# Patient Record
Sex: Male | Born: 1975 | Race: Black or African American | Hispanic: No | Smoking: Current every day smoker
Health system: Southern US, Community
[De-identification: ages and names within clinical notes are randomized; demographics above are authoritative.]

## PROBLEM LIST (undated history)

## (undated) HISTORY — PX: FRACTURE SURGERY: SHX138

---

## 2004-01-15 ENCOUNTER — Emergency Department (HOSPITAL_COMMUNITY): Admission: EM | Admit: 2004-01-15 | Discharge: 2004-01-15 | Payer: Self-pay | Admitting: Emergency Medicine

## 2018-12-24 ENCOUNTER — Emergency Department (HOSPITAL_COMMUNITY): Payer: Medicaid Other

## 2018-12-24 ENCOUNTER — Emergency Department (HOSPITAL_COMMUNITY): Payer: Medicaid Other | Admitting: Anesthesiology

## 2018-12-24 ENCOUNTER — Observation Stay (HOSPITAL_COMMUNITY)
Admission: EM | Admit: 2018-12-24 | Discharge: 2018-12-25 | Disposition: A | Discharge status: Home / Self Care | Payer: Medicaid Other | Attending: Orthopedic Surgery | Admitting: Orthopedic Surgery

## 2018-12-24 ENCOUNTER — Encounter (HOSPITAL_COMMUNITY)
Admission: EM | Disposition: A | Discharge status: Home / Self Care | Payer: Self-pay | Source: Home / Self Care | Attending: Emergency Medicine

## 2018-12-24 ENCOUNTER — Other Ambulatory Visit: Payer: Self-pay

## 2018-12-24 ENCOUNTER — Encounter (HOSPITAL_COMMUNITY): Payer: Self-pay | Admitting: Emergency Medicine

## 2018-12-24 DIAGNOSIS — S82871D Displaced pilon fracture of right tibia, subsequent encounter for closed fracture with routine healing: Secondary | ICD-10-CM | POA: Diagnosis present

## 2018-12-24 DIAGNOSIS — Z1159 Encounter for screening for other viral diseases: Secondary | ICD-10-CM | POA: Insufficient documentation

## 2018-12-24 DIAGNOSIS — W1789XA Other fall from one level to another, initial encounter: Secondary | ICD-10-CM | POA: Insufficient documentation

## 2018-12-24 DIAGNOSIS — S82201A Unspecified fracture of shaft of right tibia, initial encounter for closed fracture: Secondary | ICD-10-CM

## 2018-12-24 DIAGNOSIS — S82871A Displaced pilon fracture of right tibia, initial encounter for closed fracture: Secondary | ICD-10-CM | POA: Diagnosis not present

## 2018-12-24 DIAGNOSIS — F17210 Nicotine dependence, cigarettes, uncomplicated: Secondary | ICD-10-CM | POA: Diagnosis not present

## 2018-12-24 DIAGNOSIS — R52 Pain, unspecified: Secondary | ICD-10-CM

## 2018-12-24 HISTORY — PX: ORIF ANKLE FRACTURE: SHX5408

## 2018-12-24 LAB — CBC WITH DIFFERENTIAL/PLATELET
Abs Immature Granulocytes: 0.06 10*3/uL (ref 0.00–0.07)
Basophils Absolute: 0.1 10*3/uL (ref 0.0–0.1)
Basophils Relative: 1 %
Eosinophils Absolute: 0.2 10*3/uL (ref 0.0–0.5)
Eosinophils Relative: 1 %
HCT: 48.1 % (ref 39.0–52.0)
Hemoglobin: 16 g/dL (ref 13.0–17.0)
Immature Granulocytes: 0 %
Lymphocytes Relative: 16 %
Lymphs Abs: 2.6 10*3/uL (ref 0.7–4.0)
MCH: 28 pg (ref 26.0–34.0)
MCHC: 33.3 g/dL (ref 30.0–36.0)
MCV: 84.2 fL (ref 80.0–100.0)
Monocytes Absolute: 1 10*3/uL (ref 0.1–1.0)
Monocytes Relative: 6 %
Neutro Abs: 12.8 10*3/uL — ABNORMAL HIGH (ref 1.7–7.7)
Neutrophils Relative %: 76 %
Platelets: 376 10*3/uL (ref 150–400)
RBC: 5.71 MIL/uL (ref 4.22–5.81)
RDW: 13.5 % (ref 11.5–15.5)
WBC: 16.9 10*3/uL — ABNORMAL HIGH (ref 4.0–10.5)
nRBC: 0 % (ref 0.0–0.2)

## 2018-12-24 LAB — SARS CORONAVIRUS 2 BY RT PCR (HOSPITAL ORDER, PERFORMED IN ~~LOC~~ HOSPITAL LAB): SARS Coronavirus 2: NEGATIVE

## 2018-12-24 LAB — BASIC METABOLIC PANEL
Anion gap: 10 (ref 5–15)
BUN: 15 mg/dL (ref 6–20)
CO2: 21 mmol/L — ABNORMAL LOW (ref 22–32)
Calcium: 9 mg/dL (ref 8.9–10.3)
Chloride: 108 mmol/L (ref 98–111)
Creatinine, Ser: 1.3 mg/dL — ABNORMAL HIGH (ref 0.61–1.24)
GFR calc Af Amer: >60 mL/min (ref >60)
GFR calc non Af Amer: >60 mL/min (ref >60)
Glucose, Bld: 119 mg/dL — ABNORMAL HIGH (ref 70–99)
Potassium: 3.9 mmol/L (ref 3.5–5.1)
Sodium: 139 mmol/L (ref 135–145)

## 2018-12-24 SURGERY — OPEN REDUCTION INTERNAL FIXATION (ORIF) ANKLE FRACTURE
Anesthesia: General | Site: Ankle | Laterality: Right

## 2018-12-24 MED ORDER — LIDOCAINE HCL (PF) 1 % IJ SOLN
5.0000 mL | Freq: Once | INTRAMUSCULAR | Status: DC
  Filled 2018-12-24: qty 5

## 2018-12-24 MED ORDER — METHOCARBAMOL 500 MG PO TABS
500.0000 mg | ORAL_TABLET | Freq: Four times a day (QID) | ORAL | Status: DC | PRN
  Administered 2018-12-24: 500 mg via ORAL
  Filled 2018-12-24: qty 1

## 2018-12-24 MED ORDER — CEFAZOLIN SODIUM-DEXTROSE 1-4 GM/50ML-% IV SOLN
1.0000 g | Freq: Four times a day (QID) | INTRAVENOUS | Status: AC
  Administered 2018-12-24 – 2018-12-25 (×2): 1 g via INTRAVENOUS
  Filled 2018-12-24 (×2): qty 50

## 2018-12-24 MED ORDER — ONDANSETRON HCL 4 MG PO TABS: 4.0000 mg | ORAL_TABLET | Freq: Four times a day (QID) | ORAL | Status: DC | PRN

## 2018-12-24 MED ORDER — PROPOFOL 10 MG/ML IV BOLUS
INTRAVENOUS | Status: AC
  Filled 2018-12-24: qty 20

## 2018-12-24 MED ORDER — METHOCARBAMOL 1000 MG/10ML IJ SOLN
500.0000 mg | Freq: Four times a day (QID) | INTRAVENOUS | Status: DC | PRN
  Filled 2018-12-24: qty 5

## 2018-12-24 MED ORDER — SUGAMMADEX SODIUM 200 MG/2ML IV SOLN
INTRAVENOUS | Status: DC | PRN
  Administered 2018-12-24: 200 mg via INTRAVENOUS

## 2018-12-24 MED ORDER — ROCURONIUM BROMIDE 100 MG/10ML IV SOLN
INTRAVENOUS | Status: DC | PRN
  Administered 2018-12-24: 20 mg via INTRAVENOUS

## 2018-12-24 MED ORDER — METOCLOPRAMIDE HCL 5 MG/ML IJ SOLN: 5.0000 mg | Freq: Three times a day (TID) | INTRAMUSCULAR | Status: DC | PRN

## 2018-12-24 MED ORDER — HYDROMORPHONE HCL 1 MG/ML IJ SOLN
0.5000 mg | INTRAMUSCULAR | Status: DC | PRN
  Administered 2018-12-25 (×2): 1 mg via INTRAVENOUS
  Filled 2018-12-24 (×2): qty 1

## 2018-12-24 MED ORDER — MIDAZOLAM HCL 5 MG/5ML IJ SOLN
INTRAMUSCULAR | Status: DC | PRN
  Administered 2018-12-24: 2 mg via INTRAVENOUS

## 2018-12-24 MED ORDER — 0.9 % SODIUM CHLORIDE (POUR BTL) OPTIME
TOPICAL | Status: DC | PRN
  Administered 2018-12-24: 19:00:00 1000 mL

## 2018-12-24 MED ORDER — HYDROMORPHONE HCL 1 MG/ML IJ SOLN
1.0000 mg | INTRAMUSCULAR | Status: DC | PRN
Start: 2018-12-24 — End: 2018-12-24
  Administered 2018-12-24 (×2): 1 mg via INTRAVENOUS
  Filled 2018-12-24 (×2): qty 1

## 2018-12-24 MED ORDER — FENTANYL CITRATE (PF) 100 MCG/2ML IJ SOLN
INTRAMUSCULAR | Status: DC | PRN
  Administered 2018-12-24: 150 ug via INTRAVENOUS

## 2018-12-24 MED ORDER — DEXAMETHASONE SODIUM PHOSPHATE 10 MG/ML IJ SOLN
INTRAMUSCULAR | Status: DC | PRN
  Administered 2018-12-24: 10 mg via INTRAVENOUS

## 2018-12-24 MED ORDER — MIDAZOLAM HCL 2 MG/2ML IJ SOLN
INTRAMUSCULAR | Status: AC
  Filled 2018-12-24: qty 2

## 2018-12-24 MED ORDER — OXYCODONE HCL 5 MG PO TABS
10.0000 mg | ORAL_TABLET | ORAL | Status: DC | PRN
  Administered 2018-12-25 (×3): 15 mg via ORAL
  Filled 2018-12-24 (×3): qty 3

## 2018-12-24 MED ORDER — CEFAZOLIN SODIUM-DEXTROSE 2-3 GM-%(50ML) IV SOLR
INTRAVENOUS | Status: DC | PRN
  Administered 2018-12-24: 2 g via INTRAVENOUS

## 2018-12-24 MED ORDER — PROMETHAZINE HCL 25 MG/ML IJ SOLN: 6.2500 mg | INTRAMUSCULAR | Status: DC | PRN

## 2018-12-24 MED ORDER — OXYCODONE HCL 5 MG PO TABS: 5.0000 mg | ORAL_TABLET | ORAL | Status: DC | PRN

## 2018-12-24 MED ORDER — LIDOCAINE HCL (CARDIAC) PF 100 MG/5ML IV SOSY
PREFILLED_SYRINGE | INTRAVENOUS | Status: DC | PRN
  Administered 2018-12-24: 30 mg via INTRAVENOUS

## 2018-12-24 MED ORDER — DOCUSATE SODIUM 100 MG PO CAPS
100.0000 mg | ORAL_CAPSULE | Freq: Two times a day (BID) | ORAL | Status: DC
  Administered 2018-12-25: 100 mg via ORAL
  Filled 2018-12-24: qty 1

## 2018-12-24 MED ORDER — CEFAZOLIN SODIUM-DEXTROSE 2-4 GM/100ML-% IV SOLN
INTRAVENOUS | Status: AC
  Filled 2018-12-24: qty 100

## 2018-12-24 MED ORDER — ACETAMINOPHEN 10 MG/ML IV SOLN
INTRAVENOUS | Status: DC | PRN
  Administered 2018-12-24: 1000 mg via INTRAVENOUS

## 2018-12-24 MED ORDER — FENTANYL CITRATE (PF) 100 MCG/2ML IJ SOLN: 50.0000 ug | INTRAMUSCULAR | Status: DC | PRN

## 2018-12-24 MED ORDER — ACETAMINOPHEN 10 MG/ML IV SOLN
INTRAVENOUS | Status: AC
  Filled 2018-12-24: qty 100

## 2018-12-24 MED ORDER — METOCLOPRAMIDE HCL 5 MG PO TABS: 5.0000 mg | ORAL_TABLET | Freq: Three times a day (TID) | ORAL | Status: DC | PRN

## 2018-12-24 MED ORDER — ASPIRIN EC 325 MG PO TBEC
325.0000 mg | DELAYED_RELEASE_TABLET | Freq: Every day | ORAL | Status: DC
  Administered 2018-12-25: 325 mg via ORAL
  Filled 2018-12-24: qty 1

## 2018-12-24 MED ORDER — SUCCINYLCHOLINE CHLORIDE 20 MG/ML IJ SOLN
INTRAMUSCULAR | Status: DC | PRN
  Administered 2018-12-24: 100 mg via INTRAVENOUS

## 2018-12-24 MED ORDER — SODIUM CHLORIDE 0.9 % IV BOLUS
1000.0000 mL | Freq: Once | INTRAVENOUS | Status: AC
  Administered 2018-12-24: 1000 mL via INTRAVENOUS

## 2018-12-24 MED ORDER — LACTATED RINGERS IV SOLN
INTRAVENOUS | Status: DC | PRN
  Administered 2018-12-24 (×2): via INTRAVENOUS

## 2018-12-24 MED ORDER — PROPOFOL 10 MG/ML IV BOLUS
INTRAVENOUS | Status: DC | PRN
  Administered 2018-12-24: 200 mg via INTRAVENOUS

## 2018-12-24 MED ORDER — FENTANYL CITRATE (PF) 100 MCG/2ML IJ SOLN
25.0000 ug | INTRAMUSCULAR | Status: DC | PRN
  Administered 2018-12-24 (×2): 50 ug via INTRAVENOUS

## 2018-12-24 MED ORDER — ONDANSETRON HCL 4 MG/2ML IJ SOLN
INTRAMUSCULAR | Status: DC | PRN
  Administered 2018-12-24: 4 mg via INTRAVENOUS

## 2018-12-24 MED ORDER — FENTANYL CITRATE (PF) 250 MCG/5ML IJ SOLN
INTRAMUSCULAR | Status: AC
  Filled 2018-12-24: qty 5

## 2018-12-24 MED ORDER — MAGNESIUM CITRATE PO SOLN: 1.0000 | Freq: Once | ORAL | Status: DC | PRN

## 2018-12-24 MED ORDER — POLYETHYLENE GLYCOL 3350 17 G PO PACK: 17.0000 g | PACK | Freq: Every day | ORAL | Status: DC | PRN

## 2018-12-24 MED ORDER — HYDROMORPHONE HCL 1 MG/ML IJ SOLN
1.0000 mg | Freq: Once | INTRAMUSCULAR | Status: AC
  Administered 2018-12-24: 1 mg via INTRAVENOUS
  Filled 2018-12-24: qty 1

## 2018-12-24 MED ORDER — ACETAMINOPHEN 325 MG PO TABS
325.0000 mg | ORAL_TABLET | Freq: Four times a day (QID) | ORAL | Status: DC | PRN
  Filled 2018-12-24: qty 2

## 2018-12-24 MED ORDER — ONDANSETRON HCL 4 MG/2ML IJ SOLN: 4.0000 mg | Freq: Four times a day (QID) | INTRAMUSCULAR | Status: DC | PRN

## 2018-12-24 MED ORDER — FENTANYL CITRATE (PF) 100 MCG/2ML IJ SOLN: 100.0000 ug | Freq: Once | INTRAMUSCULAR | Status: DC

## 2018-12-24 MED ORDER — SODIUM CHLORIDE 0.9 % IV SOLN
INTRAVENOUS | Status: DC
  Administered 2018-12-24: via INTRAVENOUS

## 2018-12-24 MED ORDER — ONDANSETRON HCL 4 MG/2ML IJ SOLN
4.0000 mg | Freq: Once | INTRAMUSCULAR | Status: AC
  Administered 2018-12-24: 4 mg via INTRAVENOUS
  Filled 2018-12-24: qty 2

## 2018-12-24 MED ORDER — FENTANYL CITRATE (PF) 100 MCG/2ML IJ SOLN
INTRAMUSCULAR | Status: AC
  Filled 2018-12-24: qty 2

## 2018-12-24 MED ORDER — BISACODYL 10 MG RE SUPP: 10.0000 mg | Freq: Every day | RECTAL | Status: DC | PRN

## 2018-12-24 MED ORDER — FENTANYL CITRATE (PF) 100 MCG/2ML IJ SOLN
100.0000 ug | Freq: Once | INTRAMUSCULAR | Status: AC
  Administered 2018-12-24: 100 ug via INTRAVENOUS
  Filled 2018-12-24: qty 2

## 2018-12-24 SURGICAL SUPPLY — 55 items
BANDAGE ESMARK 6X9 LF (GAUZE/BANDAGES/DRESSINGS) IMPLANT
BIT DRILL 2.5 X LONG (BIT) ×2
BIT DRILL LCP QC 2X140 (BIT) ×2 IMPLANT
BIT DRILL X LONG 2.5 (BIT) IMPLANT
BNDG CMPR 9X6 STRL LF SNTH (GAUZE/BANDAGES/DRESSINGS) ×1
BNDG COHESIVE 4X5 TAN STRL (GAUZE/BANDAGES/DRESSINGS) ×3 IMPLANT
BNDG ESMARK 6X9 LF (GAUZE/BANDAGES/DRESSINGS) ×3
BNDG GAUZE ELAST 4 BULKY (GAUZE/BANDAGES/DRESSINGS) ×3 IMPLANT
CANISTER WOUNDNEG PRESSURE 500 (CANNISTER) ×2 IMPLANT
COVER SURGICAL LIGHT HANDLE (MISCELLANEOUS) ×3 IMPLANT
COVER WAND RF STERILE (DRAPES) ×3 IMPLANT
DRAPE OEC MINIVIEW 54X84 (DRAPES) ×2 IMPLANT
DRAPE U-SHAPE 47X51 STRL (DRAPES) ×3 IMPLANT
DRILL BIT X LONG 2.5 (BIT) ×6
DRSG ADAPTIC 3X8 NADH LF (GAUZE/BANDAGES/DRESSINGS) ×3 IMPLANT
DRSG PAD ABDOMINAL 8X10 ST (GAUZE/BANDAGES/DRESSINGS) ×5 IMPLANT
DURAPREP 26ML APPLICATOR (WOUND CARE) ×3 IMPLANT
ELECT REM PT RETURN 9FT ADLT (ELECTROSURGICAL) ×3
ELECTRODE REM PT RTRN 9FT ADLT (ELECTROSURGICAL) ×1 IMPLANT
GAUZE SPONGE 4X4 12PLY STRL (GAUZE/BANDAGES/DRESSINGS) ×3 IMPLANT
GAUZE SPONGE 4X4 12PLY STRL LF (GAUZE/BANDAGES/DRESSINGS) ×2 IMPLANT
GLOVE BIO SURGEON ST LM GN SZ9 (GLOVE) ×4 IMPLANT
GLOVE BIOGEL PI IND STRL 9 (GLOVE) ×1 IMPLANT
GLOVE BIOGEL PI INDICATOR 9 (GLOVE) ×2
GLOVE SURG ORTHO 9.0 STRL STRW (GLOVE) ×3 IMPLANT
GOWN STRL REUS W/ TWL XL LVL3 (GOWN DISPOSABLE) ×3 IMPLANT
GOWN STRL REUS W/TWL XL LVL3 (GOWN DISPOSABLE) ×6
KIT BASIN OR (CUSTOM PROCEDURE TRAY) ×3 IMPLANT
KIT DRSG PREVENA PLUS 7DAY 125 (MISCELLANEOUS) ×2 IMPLANT
KIT TURNOVER KIT B (KITS) ×3 IMPLANT
MANIFOLD NEPTUNE II (INSTRUMENTS) ×1 IMPLANT
NS IRRIG 1000ML POUR BTL (IV SOLUTION) ×3 IMPLANT
PACK ORTHO EXTREMITY (CUSTOM PROCEDURE TRAY) ×3 IMPLANT
PAD ABD 8X10 STRL (GAUZE/BANDAGES/DRESSINGS) ×2 IMPLANT
PAD ARMBOARD 7.5X6 YLW CONV (MISCELLANEOUS) ×6 IMPLANT
PLATE COMP VA-LCP 172 10H (Plate) ×2 IMPLANT
PREVENA INCISION MGT 90 150 (MISCELLANEOUS) ×2 IMPLANT
SCREW CORTEX LOW PRO 3.5X28 (Screw) ×2 IMPLANT
SCREW CORTEX LOW PRO 3.5X30 (Screw) ×6 IMPLANT
SCREW CORTEX LOW PRO 3.5X32 (Screw) ×2 IMPLANT
SCREW CORTEX LP 3.5X34MM (Screw) ×2 IMPLANT
SCREW LOCKING 2.7X44MM VA (Screw) ×2 IMPLANT
SCREW LOCKING VA 2.7X40MM (Screw) ×2 IMPLANT
SCREW LOCKING VA 2.7X50MM (Screw) ×2 IMPLANT
SPONGE LAP 18X18 RF (DISPOSABLE) ×2 IMPLANT
STAPLER VISISTAT 35W (STAPLE) ×2 IMPLANT
SUCTION FRAZIER HANDLE 10FR (MISCELLANEOUS) ×2
SUCTION TUBE FRAZIER 10FR DISP (MISCELLANEOUS) ×1 IMPLANT
SUT ETHILON 2 0 PSLX (SUTURE) ×6 IMPLANT
SUT VIC AB 2-0 CT1 27 (SUTURE)
SUT VIC AB 2-0 CT1 TAPERPNT 27 (SUTURE) ×1 IMPLANT
TOWEL GREEN STERILE (TOWEL DISPOSABLE) ×3 IMPLANT
TOWEL GREEN STERILE FF (TOWEL DISPOSABLE) ×3 IMPLANT
TUBE CONNECTING 12'X1/4 (SUCTIONS) ×1
TUBE CONNECTING 12X1/4 (SUCTIONS) ×2 IMPLANT

## 2018-12-24 NOTE — Op Note (Signed)
12/24/2018  8:43 PM  PATIENT:  Jeff Chandler    PRE-OPERATIVE DIAGNOSIS:  pilon fracture right ankle  POST-OPERATIVE DIAGNOSIS:  Same  PROCEDURE:  OPEN REDUCTION INTERNAL FIXATION (ORIF) PILON FRACTURE Anterior and lateral compartment release leg right leg. C-arm fluoroscopy to verify reduction. Application of Praveena wound VAC.  SURGEON:  Newt Minion, MD  PHYSICIAN ASSISTANT:None ANESTHESIA:   General  PREOPERATIVE INDICATIONS:  Jeff Chandler is a  43 y.o. male with a diagnosis of pilon fracture right ankle who failed conservative measures and elected for surgical management.    The risks benefits and alternatives were discussed with the patient preoperatively including but not limited to the risks of infection, bleeding, nerve injury, cardiopulmonary complications, the need for revision surgery, among others, and the patient was willing to proceed.  OPERATIVE IMPLANTS: Anterior lateral Synthes plate and Praveena wound VAC  @ENCIMAGES @  OPERATIVE FINDINGS: Combination of the pilon fracture with multiple loose fragments without vascularity  OPERATIVE PROCEDURE: Patient was brought the operating room underwent a general anesthetic.  After adequate levels anesthesia obtained patient's right lower extremity was prepped using DuraPrep draped into a sterile field a timeout was called.  An anterior lateral incision was made this was carried down through the skin blunt dissection was then carried down to protect the superficial peroneal nerve this was retracted the incision was carried down to the anterior lateral compartments.  These were released.  The fracture was then multiple fragments.  There are multiple loose fragments without any muscular attachment these were discarded.  The 2 proximal fragments were reduced and stabilized with a lag screw.  The construct was then reduced to the distal fragments and anterior lateral plate was placed with locking screws distally x3 and compression  screws proximally x3.  C-arm fluoroscopy verified congruence of the mortise the tibia was out to length the rotation was checked.  The wound was irrigated with normal saline.  The incision was closed using 2-0 nylon a Praveena wound VAC was applied this had a good suction fit patient was extubated taken the PACU in stable condition.   DISCHARGE PLANNING:  Antibiotic duration: 24-hour antibiotics  Weightbearing: Nonweightbearing on the right  Pain medication: Opioid pathway  Dressing care/ Wound VAC: Wound VAC for 1 week  Ambulatory devices: Walker or crutches  Discharge to: Home after observation  Follow-up: In the office 1 week post operative.

## 2018-12-24 NOTE — Progress Notes (Signed)
Orthopedic Tech Progress Note Patient Details:  Jeff Chandler Jun 10, 1976 [id number] Applied splint with DO and PA at bedside. Patient was screaming with a lot of pain begging up to stop. DR DUDA wanted it stabilized enough just for surgery until later today. Ortho Devices Type of Ortho Device: Post (long leg) splint Ortho Device/Splint Location: LRE Ortho Device/Splint Interventions: Application, Ordered   Post Interventions Patient Tolerated: Poor Instructions Provided: Care of device, Adjustment of device   Janit Pagan 12/24/2018, 4:05 PM

## 2018-12-24 NOTE — Anesthesia Preprocedure Evaluation (Addendum)
Anesthesia Evaluation  Patient identified by MRN, date of birth, ID band Patient awake    Reviewed: Allergy & Precautions, NPO status , Patient's Chart, lab work & pertinent test results  Airway Mallampati: II  TM Distance: >3 FB Neck ROM: Full    Dental  (+) Dental Advisory Given, Partial Lower, Partial Upper   Pulmonary Current Smoker,    Pulmonary exam normal breath sounds clear to auscultation       Cardiovascular negative cardio ROS Normal cardiovascular exam Rhythm:Regular Rate:Normal     Neuro/Psych negative neurological ROS  negative psych ROS   GI/Hepatic negative GI ROS, (+)     substance abuse  marijuana use,   Endo/Other  negative endocrine ROS  Renal/GU negative Renal ROS     Musculoskeletal Right pilon fracture    Abdominal   Peds  Hematology negative hematology ROS (+)   Anesthesia Other Findings Day of surgery medications reviewed with the patient.  Reproductive/Obstetrics                            Anesthesia Physical Anesthesia Plan  ASA: II and emergent  Anesthesia Plan: General   Post-op Pain Management:    Induction: Intravenous  PONV Risk Score and Plan: 2 and Midazolam, Dexamethasone and Ondansetron  Airway Management Planned: Oral ETT  Additional Equipment:   Intra-op Plan:   Post-operative Plan: Extubation in OR  Informed Consent: I have reviewed the patients History and Physical, chart, labs and discussed the procedure including the risks, benefits and alternatives for the proposed anesthesia with the patient or authorized representative who has indicated his/her understanding and acceptance.     Dental advisory given  Plan Discussed with: CRNA  Anesthesia Plan Comments:         Anesthesia Quick Evaluation
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
## 2018-12-24 NOTE — ED Notes (Signed)
BELONGINGS IN A BAG - WEDDING RING LOCKED IN SAFE IN SECURITY

## 2018-12-24 NOTE — ED Notes (Signed)
Dr Duda at bedside. 

## 2018-12-24 NOTE — ED Notes (Signed)
Report given to OR.
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
## 2018-12-24 NOTE — Transfer of Care (Signed)
Immediate Anesthesia Transfer of Care Note  Patient: [facility]  Procedure(s) Performed: OPEN REDUCTION INTERNAL FIXATION (ORIF) PILON FRACTURE (Right Ankle)  Patient Location: PACU  Anesthesia Type:General  Level of Consciousness: awake, alert  and oriented  Airway & Oxygen Therapy: Patient Spontanous Breathing  Post-op Assessment: Report given to RN and Post -op Vital signs reviewed and stable  Post vital signs: Reviewed and stable  Last Vitals:  Vitals Value Taken Time  BP 130/80 12/24/18 2102  Temp 36.1 C 12/24/18 2058  Pulse 103 12/24/18 2104  Resp 24 12/24/18 2104  SpO2 97 % 12/24/18 2104  Vitals shown include unvalidated device data.  Last Pain:  Vitals:   12/24/18 1742  TempSrc:   PainSc: 9          Complications: No apparent anesthesia complications
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
## 2018-12-25 MED ORDER — OXYCODONE-ACETAMINOPHEN 5-325 MG PO TABS: 1.0000 | ORAL_TABLET | ORAL | 0 refills | Status: DC | PRN

## 2018-12-25 NOTE — Plan of Care (Signed)
  Problem: Education: Goal: Knowledge of General Education information will improve Description: Including pain rating scale, medication(s)/side effects and non-pharmacologic comfort measures Outcome: Progressing   Problem: Clinical Measurements: Goal: Ability to maintain clinical measurements within normal limits will improve Outcome: Progressing   Problem: Nutrition: Goal: Adequate nutrition will be maintained Outcome: Progressing   Problem: Pain Managment: Goal: General experience of comfort will improve Outcome: Progressing   Problem: Safety: Goal: Ability to remain free from injury will improve Outcome: Progressing   Problem: Skin Integrity: Goal: Risk for impaired skin integrity will decrease Outcome: Progressing   
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
## 2018-12-25 NOTE — Discharge Summary (Signed)
Discharge Diagnoses:  Active Problems:   Displaced pilon fracture of right tibia, initial encounter for closed fracture   Pilon fracture of right tibia, closed, initial encounter   Surgeries: Procedure(s): OPEN REDUCTION INTERNAL FIXATION (ORIF) PILON FRACTURE on 12/24/2018    Consultants:   Discharged Condition: Improved  Hospital Course: Jeff Chandler is an 43 y.o. male who was admitted 12/24/2018 with a chief complaint of displaced right pilon fracture, with a final diagnosis of pilon fracture right ankle.  Patient was brought to the operating room on 12/24/2018 and underwent Procedure(s): OPEN REDUCTION INTERNAL FIXATION (ORIF) PILON FRACTURE.    Patient was given perioperative antibiotics:  Anti-infectives (From admission, onward)   Start     Dose/Rate Route Frequency Ordered Stop   12/24/18 2359  ceFAZolin (ANCEF) IVPB 1 g/50 mL premix     1 g 100 mL/hr over 30 Minutes Intravenous Every 6 hours 12/24/18 2332 12/25/18 1759   12/24/18 1844  ceFAZolin (ANCEF) 2-4 GM/100ML-% IVPB    Note to Pharmacy: Ponciano OrtBrewer, Grace   : cabinet override      12/24/18 1844 12/25/18 0659    .  Patient was given sequential compression devices, early ambulation, and aspirin for DVT prophylaxis.  Recent vital signs:  Patient Vitals for the past 24 hrs:  BP Temp Temp src Pulse Resp SpO2 Height Weight  12/25/18 0425 114/75 98.8 F (37.1 C) Oral (!) 58 14 100 % - -  12/25/18 0021 124/75 98.8 F (37.1 C) Oral (!) 57 16 100 % - -  12/24/18 2152 137/85 98.7 F (37.1 C) Oral 89 14 99 % - -  12/24/18 2134 - (!) 97.3 F (36.3 C) - 77 14 99 % - -  12/24/18 2130 128/87 - - 77 14 97 % - -  12/24/18 2125 - - - 88 15 95 % - -  12/24/18 2115 (!) 141/88 - - (!) 103 15 96 % - -  12/24/18 2110 115/82 - - (!) 101 (!) 28 94 % - -  12/24/18 2100 130/80 - - (!) 105 13 94 % - -  12/24/18 2058 130/80 (!) 97 F (36.1 C) - (!) 106 13 93 % - -  12/24/18 1800 132/89 - - 97 - 98 % - -  12/24/18 1730 (!) 145/76 - - 93 -  99 % - -  12/24/18 1715 - - - 92 - 99 % - -  12/24/18 1700 (!) 144/93 - - 91 - 98 % - -  12/24/18 1530 (!) 127/93 - - 82 - 99 % - -  12/24/18 1519 - - - - - - 5\' 4"  (1.626 m) 78.9 kg  12/24/18 1517 (!) 149/81 98.7 F (37.1 C) Oral 97 16 99 % - -  .  Recent laboratory studies: Dg Tibia/fibula Right  Result Date: 12/24/2018 CLINICAL DATA:  43 year old male jumped from porch, deformity. EXAM: RIGHT TIBIA AND FIBULA - 2 VIEW COMPARISON:  None. FINDINGS: Partially visible previous right femur ORIF with the distal aspect of a intramedullary rod and 2 interlocking cortical screws appearing intact. Grossly maintained alignment at the right knee. No definite knee joint effusion. The distal femur appears intact. Spiral fractures of the proximal fibula metadiaphysis (extending along an 8-9 centimeter length) and the distal tibia metadiaphysis (10-12 centimeters in length) are comminuted. The distal tibial fracture extends into the plafond and the tibia-fibular syndesmosis. The fibula fracture demonstrates 1/2 shaft with lateral displacement and anterior displacement with posterior angulation. The tibia fracture demonstrates nearly 1 full  shaft with posterior displacement with over riding and medial angulation. Despite the suspected intra-articular involvement at the distal tibia mortise joint alignment appears to remain normal. Possible small joint effusion at the ankle. Visible bones of the right foot appear intact. IMPRESSION: 1. Long and comminuted spiral fractures of the proximal right fibula metadiaphysis and the distal right tibia (intra-articular) with displacement and angulation as detailed above. 2. Partially visible distal femur ORIF appears intact. Right ankle joint alignment is maintained. Electronically Signed   By: Odessa FlemingH  Hall M.D.   On: 12/24/2018 15:52   Dg Tibia/fibula Right Port  Result Date: 12/24/2018 CLINICAL DATA:  Postreduction films. EXAM: PORTABLE RIGHT TIBIA AND FIBULA - 2 VIEW COMPARISON:   Earlier films, same date. FINDINGS: Severely comminuted fracture of the distal tibial shaft without obvious intra-articular involvement. Improved position and alignment post reduction and posterior splint placement. Improved position and alignment spiral type fracture of the proximal fibular shaft. IMPRESSION: Slight improved position and alignment of the comminuted proximal fibular and distal tibial fractures. Electronically Signed   By: Rudie MeyerP.  Gallerani M.D.   On: 12/24/2018 17:13    Discharge Medications:   Allergies as of 12/25/2018   Not on File     Medication List    TAKE these medications   oxyCODONE-acetaminophen 5-325 MG tablet Commonly known as: PERCOCET/ROXICET Take 1 tablet by mouth every 4 (four) hours as needed.            Discharge Care Instructions  (From admission, onward)         Start     Ordered   12/25/18 0000  Non weight bearing    Question Answer Comment  Laterality right   Extremity Lower      12/25/18 0802          Diagnostic Studies: Dg Tibia/fibula Right  Result Date: 12/24/2018 CLINICAL DATA:  43 year old male jumped from porch, deformity. EXAM: RIGHT TIBIA AND FIBULA - 2 VIEW COMPARISON:  None. FINDINGS: Partially visible previous right femur ORIF with the distal aspect of a intramedullary rod and 2 interlocking cortical screws appearing intact. Grossly maintained alignment at the right knee. No definite knee joint effusion. The distal femur appears intact. Spiral fractures of the proximal fibula metadiaphysis (extending along an 8-9 centimeter length) and the distal tibia metadiaphysis (10-12 centimeters in length) are comminuted. The distal tibial fracture extends into the plafond and the tibia-fibular syndesmosis. The fibula fracture demonstrates 1/2 shaft with lateral displacement and anterior displacement with posterior angulation. The tibia fracture demonstrates nearly 1 full shaft with posterior displacement with over riding and medial angulation.  Despite the suspected intra-articular involvement at the distal tibia mortise joint alignment appears to remain normal. Possible small joint effusion at the ankle. Visible bones of the right foot appear intact. IMPRESSION: 1. Long and comminuted spiral fractures of the proximal right fibula metadiaphysis and the distal right tibia (intra-articular) with displacement and angulation as detailed above. 2. Partially visible distal femur ORIF appears intact. Right ankle joint alignment is maintained. Electronically Signed   By: Odessa FlemingH  Hall M.D.   On: 12/24/2018 15:52   Dg Tibia/fibula Right Port  Result Date: 12/24/2018 CLINICAL DATA:  Postreduction films. EXAM: PORTABLE RIGHT TIBIA AND FIBULA - 2 VIEW COMPARISON:  Earlier films, same date. FINDINGS: Severely comminuted fracture of the distal tibial shaft without obvious intra-articular involvement. Improved position and alignment post reduction and posterior splint placement. Improved position and alignment spiral type fracture of the proximal fibular shaft. IMPRESSION: Slight improved position and  alignment of the comminuted proximal fibular and distal tibial fractures. Electronically Signed   By: Marijo Sanes M.D.   On: 12/24/2018 17:13    Patient benefited maximally from their hospital stay and there were no complications.     Disposition: Discharge disposition: 01-Home or Self Care      Discharge Instructions    Apply cam walker   Complete by: As directed    Laterality: Right   Call MD / Call 911   Complete by: As directed    If you experience chest pain or shortness of breath, CALL 911 and be transported to the hospital emergency room.  If you develope a fever above 101 F, pus (white drainage) or increased drainage or redness at the wound, or calf pain, call your surgeon's office.   Constipation Prevention   Complete by: As directed    Drink plenty of fluids.  Prune juice may be helpful.  You may use a stool softener, such as Colace (over the  counter) 100 mg twice a day.  Use MiraLax (over the counter) for constipation as needed.   Diet - low sodium heart healthy   Complete by: As directed    Increase activity slowly as tolerated   Complete by: As directed    Negative Pressure Wound Therapy - Incisional   Complete by: As directed    Discharge with the Praveena portable wound VAC pump.  Show patient how to plug in the unit.   Non weight bearing   Complete by: As directed    Laterality: right   Extremity: Lower     Follow-up Information    Newt Minion, MD In 1 week.   Specialty: Orthopedic Surgery Contact information: [address] [place] [address] [phone]            Signed: Newt Minion 12/25/2018, 8:02 AM

## 2018-12-25 NOTE — Progress Notes (Signed)
Orthopedic Tech Progress Note Patient Details:  Jeff Chandler 1976-01-02 [id number] RN called requesting CAM WALKER and CRUTCHES for patient. Ortho Devices Type of Ortho Device: CAM walker, Crutches Ortho Device/Splint Location: LRE Ortho Device/Splint Interventions: Adjustment, Application, Ordered   Post Interventions Patient Tolerated: Well Instructions Provided: Care of device, Poper ambulation with device, Adjustment of device   Janit Pagan 12/25/2018, 12:13 PM

## 2018-12-25 NOTE — Progress Notes (Signed)
Patient ID: Jeff Chandler, male   DOB: 18-Jul-1975, 44 y.o.   MRN: [id number] Patient without complaints this morning minimal drainage in the wound VAC canister.  Plan for discharge after physical therapy with discharge with the Praveena portable wound VAC pump.  Patient will need to plug in the portable pump and recommended an aspirin a day for DVT prophylaxis

## 2018-12-25 NOTE — Anesthesia Postprocedure Evaluation (Signed)
Anesthesia Post Note  Patient: [facility]  Procedure(s) Performed: OPEN REDUCTION INTERNAL FIXATION (ORIF) PILON FRACTURE (Right Ankle)     Patient location during evaluation: PACU Anesthesia Type: General Level of consciousness: awake and alert Pain management: pain level controlled Vital Signs Assessment: post-procedure vital signs reviewed and stable Respiratory status: spontaneous breathing, nonlabored ventilation, respiratory function stable and patient connected to nasal cannula oxygen Cardiovascular status: blood pressure returned to baseline and stable Postop Assessment: no apparent nausea or vomiting Anesthetic complications: no    Last Vitals:  Vitals:   12/24/18 2134 12/24/18 2152  BP:  137/85  Pulse: 77 89  Resp: 14 14  Temp: (!) 36.3 C 37.1 C  SpO2: 99% 99%    Last Pain:  Vitals:   12/24/18 2314  TempSrc:   PainSc: [place]

## 2018-12-26 ENCOUNTER — Encounter (HOSPITAL_COMMUNITY): Payer: Self-pay | Admitting: Orthopedic Surgery

## 2019-01-02 ENCOUNTER — Ambulatory Visit (INDEPENDENT_AMBULATORY_CARE_PROVIDER_SITE_OTHER): Payer: Self-pay | Admitting: Physician Assistant

## 2019-01-02 ENCOUNTER — Encounter: Payer: Self-pay | Admitting: Physician Assistant

## 2019-01-02 ENCOUNTER — Other Ambulatory Visit: Payer: Self-pay

## 2019-01-02 VITALS — Ht 64.0 in | Wt 174.0 lb

## 2019-01-02 DIAGNOSIS — S82871D Displaced pilon fracture of right tibia, subsequent encounter for closed fracture with routine healing: Secondary | ICD-10-CM

## 2019-01-02 MED ORDER — TRAMADOL HCL 50 MG PO TABS: 50.0000 mg | ORAL_TABLET | Freq: Four times a day (QID) | ORAL | 0 refills | Status: DC | PRN

## 2019-01-02 NOTE — Progress Notes (Signed)
Office Visit Note   Patient: Jeff Chandler           Date of Birth: [birth year]           MRN: [id number] Visit Date: 01/02/2019              Requested by: No referring provider defined for this encounter. PCP: Patient, No Pcp Per  Chief Complaint  Patient presents with  . Right Leg - Routine Post Op    12/24/2018 ORIF PILON FX  Vac Removed 01/02/2019 Non-WTB on Right Leg      HPI: The patient is a 43 year old gentleman who is seen for postoperative follow-up following open reduction internal fixation of a right ankle pilon fracture on 12/24/2018.  He has had the [facility] in place for the past week.  He also underwent anterior and lateral compartment releases of the right leg through the same incision.  He has been nonweightbearing on the right lower extremity and utilizing a fracture boot.  He does not like the way the oxycodone makes him feel and requests something else for pain.  Assessment & Plan: Visit Diagnoses:  1. Closed displaced pilon fracture of right tibia with routine healing, subsequent encounter     Plan: The Praveena VAC was removed this visit.  He will continue to elevate as much as possible.  They can wash the incisional area with soap and water pat dry and apply dry gauze and Ace wrapping for edema control and continue fracture boot and strict nonweightbearing.  Ordered Ultram 50 mg p.o. every 6 hours as needed pain. He will follow-up in about 10 days with right ankle radiographs at the next visit.  Follow-Up Instructions: Return in about 9 days (around 01/11/2019).   Ortho Exam  Patient is alert, oriented, no adenopathy, well-dressed, normal affect, normal respiratory effort. The Praveena VAC was removed from the right ankle and the incision is healing well with sutures intact under mild tension.  There is scant heme drainage from the area.  He is neurovascularly intact distally.  He has good pedal pulses.  There are no signs of cellulitis or infection.  Dry  dressings were applied to the incisional area and Ace wrapping and the patient was placed back in his fracture boot.  Imaging: No results found. No images are attached to the encounter.  Labs: No results found for: HGBA1C, ESRSEDRATE, CRP, LABURIC, REPTSTATUS, GRAMSTAIN, CULT, LABORGA   No results found for: ALBUMIN, PREALBUMIN, LABURIC  No results found for: MG No results found for: VD25OH  No results found for: PREALBUMIN CBC EXTENDED Latest Ref Rng & Units 12/24/2018  WBC 4.0 - 10.5 K/uL 16.9(H)  RBC 4.22 - 5.81 MIL/uL 5.71  HGB 13.0 - 17.0 g/dL 16.0  HCT 39.0 - 52.0 % 48.1  PLT 150 - 400 K/uL 376  NEUTROABS 1.7 - 7.7 K/uL 12.8(H)  LYMPHSABS 0.7 - 4.0 K/uL 2.6     Body mass index is 29.87 kg/m.  Orders:  No orders of the defined types were placed in this encounter.  Meds ordered this encounter  Medications  . traMADol (ULTRAM) 50 MG tablet    Sig: Take 1 tablet (50 mg total) by mouth every 6 (six) hours as needed for severe pain.    Dispense:  30 tablet    Refill:  0     Procedures: No procedures performed  Clinical Data: No additional findings.  ROS:  All other systems negative, except as noted in the HPI. Review of Systems  Objective: Vital Signs: Ht 5\' 4"  (1.626 m)   Wt 174 lb (78.9 kg)   BMI 29.87 kg/m   Specialty Comments:  No specialty comments available.  PMFS History: Patient Active Problem List   Diagnosis Date Noted  . Pilon fracture of right tibia, closed, initial encounter 12/24/2018  . Displaced pilon fracture of right tibia, initial encounter for closed fracture    History reviewed. No pertinent past medical history.  History reviewed. No pertinent family history.  Past Surgical History:  Procedure Laterality Date  . FRACTURE SURGERY    . ORIF ANKLE FRACTURE Right 12/24/2018   Procedure: OPEN REDUCTION INTERNAL FIXATION (ORIF) PILON FRACTURE;  Surgeon: Nadara Mustarduda, Marcus V, MD;  Location: [facility];  Service: Orthopedics;  Laterality: Right;    Social History   Occupational History  . Not on file  Tobacco Use  . Smoking status: Current Every Day Smoker  . Smokeless tobacco: Never Used  Substance and Sexual Activity  . Alcohol use: Never    Frequency: Never  . Drug use: Yes    Types: Marijuana  . Sexual activity: Not on file

## 2019-01-11 ENCOUNTER — Other Ambulatory Visit: Payer: Self-pay

## 2019-01-11 ENCOUNTER — Encounter: Payer: Self-pay | Admitting: Family

## 2019-01-11 ENCOUNTER — Ambulatory Visit (INDEPENDENT_AMBULATORY_CARE_PROVIDER_SITE_OTHER): Payer: Self-pay | Admitting: Family

## 2019-01-11 ENCOUNTER — Ambulatory Visit: Payer: Self-pay

## 2019-01-11 VITALS — Ht 64.0 in | Wt 174.0 lb

## 2019-01-11 DIAGNOSIS — S82871D Displaced pilon fracture of right tibia, subsequent encounter for closed fracture with routine healing: Secondary | ICD-10-CM

## 2019-01-11 MED ORDER — HYDROCODONE-ACETAMINOPHEN 5-325 MG PO TABS: 1.0000 | ORAL_TABLET | Freq: Four times a day (QID) | ORAL | 0 refills | Status: DC | PRN

## 2019-01-11 NOTE — Progress Notes (Signed)
   Post-Op Visit Note   Patient: Jeff Chandler           Date of Birth: 10-04-1975           MRN: [id number] Visit Date: 01/11/2019 PCP: Patient, No Pcp Per  Chief Complaint:  Chief Complaint  Patient presents with  . Right Ankle - Routine Post Op    12/24/2018 ORIF pilon fx     HPI:  HPI The patient is a 43 year old gentleman seen today for routine postop appointment following ORIF for peel on fracture right ankle.  Today he is nonweightbearing with crutches is in a fracture boot.  He is complaining of intense pain.  He has been taking tramadol without relief.  When he was taking the oxycodone prior to the tramadol states this gave him nightmares and discontinued this medication.  Reports he has been elevating around-the-clock however continues to have swelling.  Ortho Exam Incision is clean and dry. Sutures in place. Has area of fracture blister to mid incision, is 4 cm x 2 cm. Ecchymosis surrounding. Moderate edema to foot and ankle. Is developing a heel cord contracture.   Visit Diagnoses:  1. Closed displaced pilon fracture of right tibia with routine healing, subsequent encounter   2. Displaced pilon fracture of right tibia, subsequent encounter for closed fracture with routine healing     Plan: Daily Dial soap cleansing and dry dressing changes to the incision encouraged him to elevate and use the Ace wrap for gentle compression.  May use ice continue nonweightbearing follow-up in the office in 2 weeks with radiographs of the right ankle.  Did provide a prescription for Norco.  Follow-Up Instructions: Return in about 15 days (around 01/26/2019).   Imaging: No results found.  Orders:  Orders Placed This Encounter  Procedures  . XR Tibia/Fibula Right   Meds ordered this encounter  Medications  . HYDROcodone-acetaminophen (NORCO) 5-325 MG tablet    Sig: Take 1 tablet by mouth every 6 (six) hours as needed.    Dispense:  30 tablet    Refill:  0     PMFS History:  Patient Active Problem List   Diagnosis Date Noted  . Displaced pilon fracture of right tibia, subsequent encounter for closed fracture with routine healing 12/24/2018  . Displaced pilon fracture of right tibia, initial encounter for closed fracture    History reviewed. No pertinent past medical history.  History reviewed. No pertinent family history.  Past Surgical History:  Procedure Laterality Date  . FRACTURE SURGERY    . ORIF ANKLE FRACTURE Right 12/24/2018   Procedure: OPEN REDUCTION INTERNAL FIXATION (ORIF) PILON FRACTURE;  Surgeon: Newt Minion, MD;  Location: [place];  Service: Orthopedics;  Laterality: Right;   Social History   Occupational History  . Not on file  Tobacco Use  . Smoking status: Current Every Day Smoker  . Smokeless tobacco: Never Used  Substance and Sexual Activity  . Alcohol use: Never    Frequency: Never  . Drug use: Yes    Types: Marijuana  . Sexual activity: Not on file

## 2019-01-25 ENCOUNTER — Telehealth: Payer: Self-pay | Admitting: Orthopedic Surgery

## 2019-01-25 ENCOUNTER — Ambulatory Visit (INDEPENDENT_AMBULATORY_CARE_PROVIDER_SITE_OTHER): Payer: Self-pay | Admitting: Family

## 2019-01-25 ENCOUNTER — Telehealth: Payer: Self-pay | Admitting: Family

## 2019-01-25 ENCOUNTER — Encounter: Payer: Self-pay | Admitting: Family

## 2019-01-25 ENCOUNTER — Ambulatory Visit: Payer: Self-pay

## 2019-01-25 VITALS — Ht 64.0 in | Wt 174.0 lb

## 2019-01-25 DIAGNOSIS — S82871D Displaced pilon fracture of right tibia, subsequent encounter for closed fracture with routine healing: Secondary | ICD-10-CM

## 2019-01-25 MED ORDER — CYCLOBENZAPRINE HCL 10 MG PO TABS: 10.0000 mg | ORAL_TABLET | Freq: Three times a day (TID) | ORAL | 0 refills | Status: DC | PRN

## 2019-01-25 MED ORDER — OXYCODONE-ACETAMINOPHEN 5-325 MG PO TABS: 1.0000 | ORAL_TABLET | Freq: Three times a day (TID) | ORAL | 0 refills | Status: DC | PRN

## 2019-01-25 NOTE — Telephone Encounter (Signed)
Patient called left voicemail message for a call back concerning the medication that was prescribed for him. Patient said it's the same medication he was prescribed before which gave him nightmares. The number to contact patient is [phone]

## 2019-01-25 NOTE — Telephone Encounter (Signed)
Patient was called and notified to not take Flexeril and Oxy at the same time per pharmacy and he said that he was just confused on the medication that he was prescribed today but his wife has explained it to him.States he is fine with everything now.

## 2019-01-25 NOTE — Telephone Encounter (Signed)
Pt called in said at his appt today he discussed with Junie Panning about his pain medication, told her that oxycodone, hydrocodone, and tramadol were all not working out for him and he says she was going to send in a different pain medication but at the pharmacy they told him she sent in oxycodone and he didn't want that medication anymore.  [phone]

## 2019-01-25 NOTE — Progress Notes (Signed)
   Post-Op Visit Note   Patient: Jeff Chandler           Date of Birth: 1975-09-02           MRN: [id number] Visit Date: 01/25/2019 PCP: Patient, No Pcp Per  Chief Complaint:  Chief Complaint  Patient presents with  . Right Ankle - Routine Post Op    12/24/2018 ORIF Fx right ankle    HPI:  HPI The patient is a 43 year old gentleman seen today for weeks status post open reduction with internal fixation of the right ankle fracture also has a spiral fibular fracture.  He has been nonweightbearing in a cam walker using crutches.  Continues to have some pain some cramping having cramping pain in his calf and foot which bothers him the most difficulty sleeping due to this pain. Has not been able to tolerate the Norco since last visit states this broken out in a rash so he stopped taking it.  Complaining of some tightness in his ankle has been working on passive range of motion of his ankle, states his significant other has been doing this for him. Ortho Exam On examination of his right lower extremity there is mild erythema with moderate edema.  The incision is well-healed and sutures do remain in place there is no gaping no drainage no sign of infection no warmth  Visit Diagnoses:  1. Closed displaced pilon fracture of right tibia with routine healing, subsequent encounter     Plan: Sutures harvested today.  Discussed radiographs with the patient at length.  He will continue nonweightbearing and follow-up in the office with radiographs of his ankle and tib-fib in 2 more weeks.  Follow-Up Instructions: No follow-ups on file.   Imaging: No results found.  Orders:  Orders Placed This Encounter  Procedures  . XR Ankle Complete Right  . XR Tibia/Fibula Right   Meds ordered this encounter  Medications  . cyclobenzaprine (FLEXERIL) 10 MG tablet    Sig: Take 1 tablet (10 mg total) by mouth 3 (three) times daily as needed for muscle spasms.    Dispense:  30 tablet    Refill:  0  .  oxyCODONE-acetaminophen (PERCOCET/ROXICET) 5-325 MG tablet    Sig: Take 1 tablet by mouth every 8 (eight) hours as needed for severe pain.    Dispense:  21 tablet    Refill:  0     PMFS History: Patient Active Problem List   Diagnosis Date Noted  . Displaced pilon fracture of right tibia, subsequent encounter for closed fracture with routine healing 12/24/2018  . Displaced pilon fracture of right tibia, initial encounter for closed fracture    No past medical history on file.  No family history on file.  Past Surgical History:  Procedure Laterality Date  . FRACTURE SURGERY    . ORIF ANKLE FRACTURE Right 12/24/2018   Procedure: OPEN REDUCTION INTERNAL FIXATION (ORIF) PILON FRACTURE;  Surgeon: Newt Minion, MD;  Location: [place];  Service: Orthopedics;  Laterality: Right;   Social History   Occupational History  . Not on file  Tobacco Use  . Smoking status: Current Every Day Smoker  . Smokeless tobacco: Never Used  Substance and Sexual Activity  . Alcohol use: Never    Frequency: Never  . Drug use: Yes    Types: Marijuana  . Sexual activity: Not on file

## 2019-02-09 ENCOUNTER — Other Ambulatory Visit: Payer: Self-pay

## 2019-02-09 ENCOUNTER — Encounter: Payer: Self-pay | Admitting: Orthopedic Surgery

## 2019-02-09 ENCOUNTER — Ambulatory Visit (INDEPENDENT_AMBULATORY_CARE_PROVIDER_SITE_OTHER): Payer: Medicaid Other

## 2019-02-09 ENCOUNTER — Ambulatory Visit (INDEPENDENT_AMBULATORY_CARE_PROVIDER_SITE_OTHER): Payer: Medicaid Other | Admitting: Orthopedic Surgery

## 2019-02-09 VITALS — Ht 64.0 in | Wt 174.0 lb

## 2019-02-09 DIAGNOSIS — S82871D Displaced pilon fracture of right tibia, subsequent encounter for closed fracture with routine healing: Secondary | ICD-10-CM

## 2019-02-09 DIAGNOSIS — M79604 Pain in right leg: Secondary | ICD-10-CM

## 2019-02-09 MED ORDER — OXYCODONE-ACETAMINOPHEN 5-325 MG PO TABS: 1.0000 | ORAL_TABLET | Freq: Three times a day (TID) | ORAL | 0 refills | Status: DC | PRN

## 2019-02-10 ENCOUNTER — Encounter: Payer: Self-pay | Admitting: Orthopedic Surgery

## 2019-02-10 NOTE — Progress Notes (Signed)
Office Visit Note   Patient: Jeff Chandler           Date of Birth: [birth year]           MRN: [id number] Visit Date: 02/09/2019              Requested by: No referring provider defined for this encounter. PCP: Patient, No Pcp Per  Chief Complaint  Patient presents with  . Right Leg - Routine Post Op  . Right Ankle - Routine Post Op      HPI: Patient is a 43 year old gentleman who is 44 weeks status post open reduction internal fixation pilon fracture.  Patient states he is still in some pain states that he is having pain in his thigh now.  He states he can wiggle his toes now he is been weightbearing with crutches he request an x-ray of the entire lower extremity to evaluate previous internal fixation for his femur fracture.   Assessment & Plan: Visit Diagnoses:  1. Closed displaced pilon fracture of right tibia with routine healing, subsequent encounter   2. Displaced pilon fracture of right tibia, subsequent encounter for closed fracture with routine healing   3. Pain in right leg     Plan: Plan: Smoking cessation emphasized vitamin D3 supplements ankle dorsiflexion reinforced.  Continue nonweightbearing on the right lower extremity with a fracture boot.  Prescription provided for Percocet.  Discussed the patient will be out of work for at least a year.  Follow-Up Instructions: Return in about 4 weeks (around 03/09/2019).   Ortho Exam  Patient is alert, oriented, no adenopathy, well-dressed, normal affect, normal respiratory effort. On examination patient has dorsiflexion of the ankle about 20 degrees short of neutral patient was given instructions and demonstrated Achilles stretching discussed the importance of Achilles stretching to minimize risk of equinus contracture.  With patient being 6 weeks out and still not showing any good callus formation recommended that he start vitamin D3 2000 international units a day.  Again discussed the importance of smoking cessation and  the bone will not heal with current smoking.  Patient was given a prescription for a medical compression stockings size medium his calf is 35 cm in circumference.  Imaging: No results found. No images are attached to the encounter.  Labs: No results found for: HGBA1C, ESRSEDRATE, CRP, LABURIC, REPTSTATUS, GRAMSTAIN, CULT, LABORGA   No results found for: ALBUMIN, PREALBUMIN, LABURIC  No results found for: MG No results found for: VD25OH  No results found for: PREALBUMIN CBC EXTENDED Latest Ref Rng & Units 12/24/2018  WBC 4.0 - 10.5 K/uL 16.9(H)  RBC 4.22 - 5.81 MIL/uL 5.71  HGB 13.0 - 17.0 g/dL 78.216.0  HCT 95.639.0 - 21.352.0 % 48.1  PLT 150 - 400 K/uL 376  NEUTROABS 1.7 - 7.7 K/uL 12.8(H)  LYMPHSABS 0.7 - 4.0 K/uL 2.6     Body mass index is 29.87 kg/m.  Orders:  Orders Placed This Encounter  Procedures  . XR Tibia/Fibula Right  . XR Ankle Complete Right  . XR FEMUR, MIN 2 VIEWS RIGHT   Meds ordered this encounter  Medications  . oxyCODONE-acetaminophen (PERCOCET/ROXICET) 5-325 MG tablet    Sig: Take 1 tablet by mouth every 8 (eight) hours as needed for severe pain.    Dispense:  21 tablet    Refill:  0     Procedures: No procedures performed  Clinical Data: No additional findings.  ROS:  All other systems negative, except as noted in the HPI. Review  of Systems  Objective: Vital Signs: Ht 5\' 4"  (1.626 m)   Wt 174 lb (78.9 kg)   BMI 29.87 kg/m   Specialty Comments:  No specialty comments available.  PMFS History: Patient Active Problem List   Diagnosis Date Noted  . Displaced pilon fracture of right tibia, subsequent encounter for closed fracture with routine healing 12/24/2018  . Displaced pilon fracture of right tibia, initial encounter for closed fracture    History reviewed. No pertinent past medical history.  History reviewed. No pertinent family history.  Past Surgical History:  Procedure Laterality Date  . FRACTURE SURGERY    . ORIF ANKLE FRACTURE  Right 12/24/2018   Procedure: OPEN REDUCTION INTERNAL FIXATION (ORIF) PILON FRACTURE;  Surgeon: Newt Minion, MD;  Location: [place];  Service: Orthopedics;  Laterality: Right;   Social History   Occupational History  . Not on file  Tobacco Use  . Smoking status: Current Every Day Smoker  . Smokeless tobacco: Never Used  Substance and Sexual Activity  . Alcohol use: Never    Frequency: Never  . Drug use: Yes    Types: Marijuana  . Sexual activity: Not on file

## 2019-02-20 ENCOUNTER — Other Ambulatory Visit: Payer: Self-pay | Admitting: Orthopedic Surgery

## 2019-02-20 MED ORDER — OXYCODONE-ACETAMINOPHEN 5-325 MG PO TABS: 1.0000 | ORAL_TABLET | Freq: Three times a day (TID) | ORAL | 0 refills | Status: DC | PRN

## 2019-02-20 NOTE — Telephone Encounter (Signed)
Please advise 

## 2019-02-20 NOTE — Telephone Encounter (Signed)
rx sent

## 2019-03-09 ENCOUNTER — Ambulatory Visit (INDEPENDENT_AMBULATORY_CARE_PROVIDER_SITE_OTHER): Payer: Medicaid Other | Admitting: Orthopedic Surgery

## 2019-03-09 ENCOUNTER — Encounter: Payer: Self-pay | Admitting: Orthopedic Surgery

## 2019-03-09 ENCOUNTER — Ambulatory Visit (INDEPENDENT_AMBULATORY_CARE_PROVIDER_SITE_OTHER): Payer: Medicaid Other

## 2019-03-09 ENCOUNTER — Other Ambulatory Visit: Payer: Self-pay | Admitting: Orthopedic Surgery

## 2019-03-09 VITALS — Ht 64.0 in | Wt 174.0 lb

## 2019-03-09 DIAGNOSIS — S82871D Displaced pilon fracture of right tibia, subsequent encounter for closed fracture with routine healing: Secondary | ICD-10-CM

## 2019-03-09 MED ORDER — OXYCODONE-ACETAMINOPHEN 5-325 MG PO TABS: 1.0000 | ORAL_TABLET | Freq: Three times a day (TID) | ORAL | 0 refills | Status: DC | PRN

## 2019-03-09 NOTE — Telephone Encounter (Signed)
Dr Sharol Given, please advise thank you.

## 2019-03-09 NOTE — Telephone Encounter (Signed)
Duda pt ?

## 2019-03-10 NOTE — Telephone Encounter (Signed)
Erin please advise, thanks. 

## 2019-03-14 ENCOUNTER — Encounter: Payer: Self-pay | Admitting: Orthopedic Surgery

## 2019-03-14 NOTE — Progress Notes (Signed)
Office Visit Note   Patient: Jeff Chandler           Date of Birth: August 01, 1975           MRN: [id number] Visit Date: 03/09/2019              Requested by: No referring provider defined for this encounter. PCP: Patient, No Pcp Per  Chief Complaint  Patient presents with  . Right Ankle - Routine Post Op    12/24/2018 ORIF pilon Fx      HPI: Patient is a 43 year old gentleman who is 2-1/2 months status post open reduction internal fixation pilon fracture.  He has been nonweightbearing with a fracture boot.  Patient states he does have pain at night patient complains of swelling  Assessment & Plan: Visit Diagnoses:  1. Closed displaced pilon fracture of right tibia with routine healing, subsequent encounter     Plan: Patient will advance to weightbearing as tolerated he is still smoking discussed the importance of smoking cessation.  Patient will continue work on range of motion of his ankle.  Follow-up in 4 weeks with repeat three-view radiographs of the right ankle  Follow-Up Instructions: Return in about 4 weeks (around 04/06/2019).   Ortho Exam  Patient is alert, oriented, no adenopathy, well-dressed, normal affect, normal respiratory effort. Examination there is some mild swelling dorsiflexion of 20 degrees patient has no pain with range of motion of the ankle.  The incisions are well-healed no signs of infection  Imaging: No results found. No images are attached to the encounter.  Labs: No results found for: HGBA1C, ESRSEDRATE, CRP, LABURIC, REPTSTATUS, GRAMSTAIN, CULT, LABORGA   No results found for: ALBUMIN, PREALBUMIN, LABURIC  No results found for: MG No results found for: VD25OH  No results found for: PREALBUMIN CBC EXTENDED Latest Ref Rng & Units 12/24/2018  WBC 4.0 - 10.5 K/uL 16.9(H)  RBC 4.22 - 5.81 MIL/uL 5.71  HGB 13.0 - 17.0 g/dL 16.0  HCT 39.0 - 52.0 % 48.1  PLT 150 - 400 K/uL 376  NEUTROABS 1.7 - 7.7 K/uL 12.8(H)  LYMPHSABS 0.7 - 4.0 K/uL 2.6      Body mass index is 29.87 kg/m.  Orders:  Orders Placed This Encounter  Procedures  . XR Ankle Complete Right   Meds ordered this encounter  Medications  . oxyCODONE-acetaminophen (PERCOCET/ROXICET) 5-325 MG tablet    Sig: Take 1 tablet by mouth every 8 (eight) hours as needed for severe pain.    Dispense:  21 tablet    Refill:  0     Procedures: No procedures performed  Clinical Data: No additional findings.  ROS:  All other systems negative, except as noted in the HPI. Review of Systems  Objective: Vital Signs: Ht 5\' 4"  (1.626 m)   Wt 174 lb (78.9 kg)   BMI 29.87 kg/m   Specialty Comments:  No specialty comments available.  PMFS History: Patient Active Problem List   Diagnosis Date Noted  . Displaced pilon fracture of right tibia, subsequent encounter for closed fracture with routine healing 12/24/2018  . Displaced pilon fracture of right tibia, initial encounter for closed fracture    No past medical history on file.  No family history on file.  Past Surgical History:  Procedure Laterality Date  . FRACTURE SURGERY    . ORIF ANKLE FRACTURE Right 12/24/2018   Procedure: OPEN REDUCTION INTERNAL FIXATION (ORIF) PILON FRACTURE;  Surgeon: Newt Minion, MD;  Location: [place];  Service: Orthopedics;  Laterality:  Right;   Social History   Occupational History  . Not on file  Tobacco Use  . Smoking status: Current Every Day Smoker  . Smokeless tobacco: Never Used  Substance and Sexual Activity  . Alcohol use: Never    Frequency: Never  . Drug use: Yes    Types: Marijuana  . Sexual activity: Not on file

## 2019-03-19 ENCOUNTER — Other Ambulatory Visit: Payer: Self-pay | Admitting: Orthopedic Surgery

## 2019-03-20 MED ORDER — OXYCODONE-ACETAMINOPHEN 5-325 MG PO TABS: 1.0000 | ORAL_TABLET | Freq: Three times a day (TID) | ORAL | 0 refills | Status: DC | PRN

## 2019-03-20 NOTE — Telephone Encounter (Signed)
12/2018 ORIF pilon fx please advise if you wish to refill

## 2019-03-20 NOTE — Telephone Encounter (Signed)
Dr. Duda patient 

## 2019-04-06 ENCOUNTER — Other Ambulatory Visit: Payer: Self-pay

## 2019-04-06 ENCOUNTER — Ambulatory Visit (INDEPENDENT_AMBULATORY_CARE_PROVIDER_SITE_OTHER): Payer: Medicaid Other | Admitting: Orthopedic Surgery

## 2019-04-06 ENCOUNTER — Telehealth: Payer: Self-pay | Admitting: Orthopedic Surgery

## 2019-04-06 ENCOUNTER — Encounter: Payer: Self-pay | Admitting: Orthopedic Surgery

## 2019-04-06 ENCOUNTER — Ambulatory Visit: Payer: Self-pay

## 2019-04-06 VITALS — Ht 64.0 in | Wt 174.0 lb

## 2019-04-06 DIAGNOSIS — S82871D Displaced pilon fracture of right tibia, subsequent encounter for closed fracture with routine healing: Secondary | ICD-10-CM

## 2019-04-06 MED ORDER — OXYCODONE-ACETAMINOPHEN 5-325 MG PO TABS: 1.0000 | ORAL_TABLET | Freq: Three times a day (TID) | ORAL | 0 refills | Status: DC | PRN

## 2019-04-06 NOTE — Telephone Encounter (Signed)
Patient called asked when will the Rx (Oxycodone) be sent to his pharmacy? Patient said he need to get someone to take him to get his medication. The number to contact patient is [phone]

## 2019-04-06 NOTE — Telephone Encounter (Signed)
Patient was called to inform Rx was sent, lvm.

## 2019-04-18 ENCOUNTER — Other Ambulatory Visit: Payer: Self-pay | Admitting: Orthopedic Surgery

## 2019-04-18 NOTE — Telephone Encounter (Signed)
Ok to rf? 

## 2019-04-19 ENCOUNTER — Encounter: Payer: Self-pay | Admitting: Orthopedic Surgery

## 2019-04-19 ENCOUNTER — Other Ambulatory Visit: Payer: Self-pay | Admitting: Orthopedic Surgery

## 2019-04-19 MED ORDER — OXYCODONE-ACETAMINOPHEN 5-325 MG PO TABS: 1.0000 | ORAL_TABLET | Freq: Three times a day (TID) | ORAL | 0 refills | Status: DC | PRN

## 2019-04-19 NOTE — Telephone Encounter (Signed)
rx went to [facility] 

## 2019-04-19 NOTE — Progress Notes (Signed)
Office Visit Note   Patient: Jeff Chandler           Date of Birth: 08-06-1975           MRN: [id number] Visit Date: 04/06/2019              Requested by: No referring provider defined for this encounter. PCP: Patient, No Pcp Per  Chief Complaint  Patient presents with  . Right Ankle - Routine Post Op    12/24/2018 ORIF Pilon Fx      HPI: Patient is a 43 year old gentleman who presents 3 months status post open reduction internal fixation pilon fracture right tibia.  Patient is weightbearing as tolerated in the fracture boot and crutch.  Patient states he has some itching at the incision site.  Assessment & Plan: Visit Diagnoses:  1. Closed displaced pilon fracture of right tibia with routine healing, subsequent encounter     Plan: Patient was given a refill prescription for Percocet again reinforced the importance of scar massage with Shea butter or cocoa butter.  Discussed that we will need to set him up with physical therapy and we will set this up with the new office.  Follow-Up Instructions: Return in about 4 weeks (around 05/04/2019).   Ortho Exam  Patient is alert, oriented, no adenopathy, well-dressed, normal affect, normal respiratory effort. Examination patient's foot is plantigrade.  There is no ulcers no cellulitis no signs of infection.  He does have some hypertrophy of the scar and scar massage was again reinforced.  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  Imaging: No results found. No images are attached to the encounter.  Labs: No results found for: HGBA1C, ESRSEDRATE, CRP, LABURIC, REPTSTATUS, GRAMSTAIN, CULT, LABORGA   No results found for: ALBUMIN, PREALBUMIN, LABURIC  No results found for: MG No results found for: VD25OH  No results found for: PREALBUMIN CBC EXTENDED Latest Ref Rng & Units 12/24/2018  WBC 4.0 - 10.5 K/uL 16.9(H)  RBC 4.22 - 5.81 MIL/uL 5.71  HGB 13.0 - 17.0 g/dL 16.0  HCT  39.0 - 52.0 % 48.1  PLT 150 - 400 K/uL 376  NEUTROABS 1.7 - 7.7 K/uL 12.8(H)  LYMPHSABS 0.7 - 4.0 K/uL 2.6     Body mass index is 29.87 kg/m.  Orders:  Orders Placed This Encounter  Procedures  . XR Ankle Complete Right   Meds ordered this encounter  Medications  . DISCONTD: oxyCODONE-acetaminophen (PERCOCET/ROXICET) 5-325 MG tablet    Sig: Take 1 tablet by mouth every 8 (eight) hours as needed for severe pain.    Dispense:  21 tablet    Refill:  0     Procedures: No procedures performed  Clinical Data: No additional findings.  ROS:  All other systems negative, except as noted in the HPI. Review of Systems  Objective: Vital Signs: Ht 5\' 4"  (1.626 m)   Wt 174 lb (78.9 kg)   BMI 29.87 kg/m   Specialty Comments:  No specialty comments available.  PMFS History: Patient Active Problem List   Diagnosis Date Noted  . Displaced pilon fracture of right tibia, subsequent encounter for closed fracture with routine healing 12/24/2018  . Displaced pilon fracture of right tibia, initial encounter for closed fracture    No past medical history on file.  No family history on file.  Past Surgical History:  Procedure Laterality Date  . FRACTURE SURGERY    . ORIF ANKLE FRACTURE Right 12/24/2018   Procedure:  OPEN REDUCTION INTERNAL FIXATION (ORIF) PILON FRACTURE;  Surgeon: Nadara Mustard, MD;  Location: [facility] OR;  Service: Orthopedics;  Laterality: Right;   Social History   Occupational History  . Not on file  Tobacco Use  . Smoking status: Current Every Day Smoker  . Smokeless tobacco: Never Used  Substance and Sexual Activity  . Alcohol use: Never    Frequency: Never  . Drug use: Yes    Types: Marijuana  . Sexual activity: Not on file

## 2019-04-19 NOTE — Telephone Encounter (Signed)
duda

## 2019-04-20 ENCOUNTER — Other Ambulatory Visit: Payer: Self-pay | Admitting: Orthopedic Surgery

## 2019-04-20 NOTE — Telephone Encounter (Signed)
rx sent in yesterday 

## 2019-04-20 NOTE — Telephone Encounter (Signed)
Pt is s/p a ORIF pilon fx 12/24/18 do you wish to refill rx?

## 2019-04-20 NOTE — Telephone Encounter (Signed)
Duda pt ?

## 2019-04-25 ENCOUNTER — Ambulatory Visit: Payer: Medicaid Other | Admitting: Physical Therapy

## 2019-05-01 ENCOUNTER — Other Ambulatory Visit: Payer: Self-pay | Admitting: Orthopedic Surgery

## 2019-05-01 DIAGNOSIS — S82871D Displaced pilon fracture of right tibia, subsequent encounter for closed fracture with routine healing: Secondary | ICD-10-CM

## 2019-05-01 MED ORDER — OXYCODONE-ACETAMINOPHEN 5-325 MG PO TABS: 1.0000 | ORAL_TABLET | Freq: Three times a day (TID) | ORAL | 0 refills | Status: DC | PRN

## 2019-05-01 NOTE — Telephone Encounter (Signed)
Please advise 

## 2019-05-04 ENCOUNTER — Ambulatory Visit (INDEPENDENT_AMBULATORY_CARE_PROVIDER_SITE_OTHER): Payer: Medicaid Other

## 2019-05-04 ENCOUNTER — Encounter: Payer: Self-pay | Admitting: Orthopedic Surgery

## 2019-05-04 ENCOUNTER — Other Ambulatory Visit: Payer: Self-pay

## 2019-05-04 ENCOUNTER — Ambulatory Visit (INDEPENDENT_AMBULATORY_CARE_PROVIDER_SITE_OTHER): Payer: Medicaid Other | Admitting: Orthopedic Surgery

## 2019-05-04 VITALS — Ht 64.0 in | Wt 174.0 lb

## 2019-05-04 DIAGNOSIS — S82871D Displaced pilon fracture of right tibia, subsequent encounter for closed fracture with routine healing: Secondary | ICD-10-CM

## 2019-05-04 NOTE — Progress Notes (Signed)
   Office Visit Note   Patient: Jeff Chandler           Date of Birth: October 10, 1975           MRN: [id number] Visit Date: 05/04/2019              Requested by: No referring provider defined for this encounter. PCP: Patient, No Pcp Per  Chief Complaint  Patient presents with  . Right Ankle - Follow-up    S/p 12/24/2018 ORIF Pilon Fx      HPI: 4 mos s/p ORIF R Pilon Fracture. Feeling better.  Has been working on active dorsiflexion and light strengthening. Begins PT 12/2. Wearing a compression sock  Assessment & Plan: Visit Diagnoses:  1. Closed displaced pilon fracture of right tibia with routine healing, subsequent encounter     Plan: Will begin PT  And focus on light strengthening  And continued dorsiflexion. Follow up 1 month with new xrays.  Follow-Up Instructions: No follow-ups on file.   Ortho Exam  Patient is alert, oriented, no adenopathy, well-dressed, normal affect, normal respiratory effort. Right Ankle: Pulses intact, well healed surgical incisions , compartments soft. Swelling controlled. Sensation is intact.   Imaging: 3 view R ankle was reviewed today . There has been some interval consolidation compared to previous films. Fracture remains well reduced  Hardware in place Labs: No results found for: HGBA1C, ESRSEDRATE, CRP, LABURIC, REPTSTATUS, GRAMSTAIN, CULT, LABORGA   No results found for: ALBUMIN, PREALBUMIN, LABURIC  No results found for: MG No results found for: VD25OH  No results found for: PREALBUMIN CBC EXTENDED Latest Ref Rng & Units 12/24/2018  WBC 4.0 - 10.5 K/uL 16.9(H)  RBC 4.22 - 5.81 MIL/uL 5.71  HGB 13.0 - 17.0 g/dL 16.0  HCT 39.0 - 52.0 % 48.1  PLT 150 - 400 K/uL 376  NEUTROABS 1.7 - 7.7 K/uL 12.8(H)  LYMPHSABS 0.7 - 4.0 K/uL 2.6     Body mass index is 29.87 kg/m.  Orders:  Orders Placed This Encounter  Procedures  . XR Ankle Complete Right   No orders of the defined types were placed in this encounter.    Procedures: No  procedures performed  Clinical Data: No additional findings.  ROS:  All other systems negative, except as noted in the HPI. Review of Systems  Objective: Vital Signs: Ht 5\' 4"  (1.626 m)   Wt 174 lb (78.9 kg)   BMI 29.87 kg/m   Specialty Comments:  No specialty comments available.  PMFS History: Patient Active Problem List   Diagnosis Date Noted  . Displaced pilon fracture of right tibia, subsequent encounter for closed fracture with routine healing 12/24/2018  . Displaced pilon fracture of right tibia, initial encounter for closed fracture    No past medical history on file.  No family history on file.  Past Surgical History:  Procedure Laterality Date  . FRACTURE SURGERY    . ORIF ANKLE FRACTURE Right 12/24/2018   Procedure: OPEN REDUCTION INTERNAL FIXATION (ORIF) PILON FRACTURE;  Surgeon: Newt Minion, MD;  Location: [facility];  Service: Orthopedics;  Laterality: Right;   Social History   Occupational History  . Not on file  Tobacco Use  . Smoking status: Current Every Day Smoker  . Smokeless tobacco: Never Used  Substance and Sexual Activity  . Alcohol use: Never    Frequency: Never  . Drug use: Yes    Types: Marijuana  . Sexual activity: Not on file

## 2019-05-24 ENCOUNTER — Other Ambulatory Visit: Payer: Self-pay

## 2019-05-24 ENCOUNTER — Other Ambulatory Visit: Payer: Self-pay | Admitting: Orthopedic Surgery

## 2019-05-24 ENCOUNTER — Ambulatory Visit: Payer: Medicaid Other | Attending: Orthopedic Surgery | Admitting: Physical Therapy

## 2019-05-24 DIAGNOSIS — M25571 Pain in right ankle and joints of right foot: Secondary | ICD-10-CM | POA: Diagnosis present

## 2019-05-24 DIAGNOSIS — R2689 Other abnormalities of gait and mobility: Secondary | ICD-10-CM

## 2019-05-24 DIAGNOSIS — M25671 Stiffness of right ankle, not elsewhere classified: Secondary | ICD-10-CM | POA: Diagnosis not present

## 2019-05-24 MED ORDER — OXYCODONE-ACETAMINOPHEN 5-325 MG PO TABS: 1.0000 | ORAL_TABLET | Freq: Three times a day (TID) | ORAL | 0 refills | Status: DC | PRN

## 2019-05-24 NOTE — Telephone Encounter (Signed)
Please advise 

## 2019-05-24 NOTE — Telephone Encounter (Signed)
rx went to [facility] 

## 2019-05-25 ENCOUNTER — Other Ambulatory Visit: Payer: Self-pay

## 2019-05-25 ENCOUNTER — Encounter: Payer: Self-pay | Admitting: Physical Therapy

## 2019-05-25 NOTE — Therapy (Signed)
[facility] [address] [place], [place], [address] Phone: [phone]   Fax:  [phone]  Physical Therapy Evaluation  Patient Details  Name: Jeff Chandler MRN: [id number] Date of Birth: 1975-10-22 Referring Provider (PT): Dr Aldean BakerMarcus Duda   Encounter Date: 05/24/2019  PT End of Session - 05/25/19 1220    Visit Number  1    Number of Visits  4    Date for PT Re-Evaluation  06/22/19    Authorization Type  Medicaid    PT Start Time  1500    PT Stop Time  1540    PT Time Calculation (min)  40 min    Activity Tolerance  Patient tolerated treatment well    Behavior During Therapy  [facility] for tasks assessed/performed       History reviewed. No pertinent past medical history.  Past Surgical History:  Procedure Laterality Date  . FRACTURE SURGERY    . ORIF ANKLE FRACTURE Right 12/24/2018   Procedure: OPEN REDUCTION INTERNAL FIXATION (ORIF) PILON FRACTURE;  Surgeon: Nadara Mustarduda, Marcus V, MD;  [facility];  Service: Orthopedics;  Laterality: Right;    There were no vitals filed for this visit.   Subjective Assessment - 05/24/19 1457    Subjective  Patient had a pilion fracture on 12/24/2018. He had an ORIF. He has been weight bearing foir about a month. His swelling and pain are well controlled. He has some pain at night. He is otherwise doing well.    How long can you stand comfortably?  Swells after a while    How long can you walk comfortably?  increased swelling with walking    Diagnostic tests  X-ray: good alignment; healing well    Patient Stated Goals  to have less pain    Currently in Pain?  Yes    Pain Score  7     Pain Location  Ankle    Pain Orientation  Right;Medial    Pain Descriptors / Indicators  Aching;Sore    Pain Type  Chronic pain    Pain Radiating Towards  minor radiating pain into his toes    Pain Onset  More than a month ago    Pain Frequency  Intermittent    Aggravating Factors   night time; standing and  walking    Pain Relieving Factors  rest    Effect of Pain on Daily Activities  difficulty perfroming ADl;s         [facility] - 05/25/19 0001      Assessment   Medical Diagnosis  Right pilon fx with ORIF     Referring Provider (PT)  Dr Aldean BakerMarcus Duda    Onset Date/Surgical Date  12/24/18    Hand Dominance  Right    Next MD Visit  06/01/2019    Prior Therapy  for femur fx when he was 14       Precautions   Precautions  None      Restrictions   Weight Bearing Restrictions  No      Balance Screen   Has the patient fallen in the past 6 months  No    Has the patient had a decrease in activity level because of a fear of falling?   No    Is the patient reluctant to leave their home because of a fear of falling?   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       Cognition   Overall Cognitive Status  Within Functional Limits for tasks assessed    Attention  Focused    Focused Attention  Appears intact    Memory  Appears intact    Awareness  Appears intact    Problem Solving  Appears intact      Observation/Other Assessments   Observations  wearing compression sock    Focus on Therapeutic Outcomes (FOTO)   Medicaid       Sensation   Light Touch  Appears Intact    Additional Comments  minor numbness into the left toe       Coordination   Gross Motor Movements are Fluid and Coordinated  Yes    Fine Motor Movements are Fluid and Coordinated  Yes      ROM / Strength   AROM / PROM / Strength  AROM;PROM;Strength      AROM   Overall AROM Comments  left ankle 15 degrees DF     AROM Assessment Site  Ankle    Right/Left Ankle  Right    Right Ankle Dorsiflexion  0    Right Ankle Plantar Flexion  28    Right Ankle Inversion  20    Right Ankle Eversion  8      PROM   PROM  Assessment Site  Ankle    Right/Left Ankle  Right    Right Ankle Dorsiflexion  5    Right Ankle Plantar Flexion  33      Strength   Overall Strength Comments  knee and hip left ankle  5/5 gross     Strength Assessment Site  Ankle    Right/Left Ankle  Right    Right Ankle Dorsiflexion  4/5    Right Ankle Inversion  4/5    Right Ankle Eversion  4/5      Palpation   Palpation comment  no unexpected tenderness to palpation. Mild sensativity in medial scar area.       Ambulation/Gait   Gait Comments  mild decrease in single leg stance time on the right; mild right toe out                 Objective measurements completed on examination: See above findings.      [facility] Adult PT Treatment/Exercise - 05/25/19 0001      Exercises   Exercises  Ankle      Manual Therapy   Manual therapy comments  gentle PROM into DF      Ankle Exercises: Stretches   Other Stretch  reviewwed DF stretching advised patient not to over stretch. he is only lacking a mild amount of DF. There is no need to be agressive.       Ankle Exercises: Supine   T-Band  4 way with red x20 each              PT Education - 05/25/19 1219    Education Details  reviewed HEp and symptom mangement    Person(s) Educated  Patient;Spouse    Methods  Explanation;Demonstration;Tactile cues;Verbal cues    Comprehension  Verbalized understanding;Returned demonstration;Verbal cues required;Tactile cues required       PT Short Term Goals - 05/25/19 0848      PT SHORT TERM GOAL #1  Title  Patient will increase passive DF to 10 degrees    Baseline  5 degrees    Time  3    Period  Weeks    Status  New    Target Date  06/15/19      PT SHORT TERM GOAL #2   Title  Patient will increase active DF to 5 degrees    Baseline  to neutral    Time  3    Period  Weeks    Status  New    Target Date  06/15/19      PT SHORT TERM GOAL #3   Title  Patient will increase ankle IV, EV and DF to 5/5    Baseline  4+/5  each    Time  3    Period  Weeks    Status  New    Target Date  06/15/19                Plan - 05/24/19 1705    Clinical Impression Statement  Patient is a 43 year old male S/P right pilon fx and ORIF. He presents with deficits in DF and strength but is doing very well. His edema is mild. He has a mild antalgic gait. Her would benrefit from skilled therapy to improve DF and functional strength.    Personal Factors and Comorbidities  Comorbidity 1    Comorbidities  old right femur fx    Examination-Activity Limitations  Squat;Stand;Stairs    Examination-Participation Restrictions  Driving;Yard Work;Cleaning;Shop    Stability/Clinical Decision Making  Stable/Uncomplicated    Clinical Decision Making  Low    Rehab Potential  Good    PT Frequency  1x / week    PT Duration  4 weeks    PT Treatment/Interventions  ADLs/Self Care Home Management;Iontophoresis 4mg /ml Dexamethasone;Moist Heat;Ultrasound;Gait training;Stair training;Therapeutic activities;Therapeutic exercise;Patient/family education;Manual techniques;Passive range of motion;Dry needling;Taping    PT Next Visit Plan  light manual therapy to the ankle; continue with focused ankle strengthening; consider baps board, standing weight shifts; standing march    PT Home Exercise Plan  ankle 4 way t-band; reviewed light ankle DF strengthening    Consulted and Agree with Plan of Care  Patient       Patient will benefit from skilled therapeutic intervention in order to improve the following deficits and impairments:  Abnormal gait, Difficulty walking, Decreased activity tolerance, Pain, Decreased range of motion, Decreased strength, Decreased mobility  Visit Diagnosis: Stiffness of right ankle, not elsewhere classified  Pain in right ankle and joints of right foot  Other abnormalities of gait and mobility     Problem List Patient Active Problem List   Diagnosis Date Noted  . Displaced pilon fracture of right tibia,  subsequent encounter for closed fracture with routine healing 12/24/2018  . Displaced pilon fracture of right tibia, initial encounter for closed fracture     02/24/2019 PT DPT  05/25/2019, 12:25 PM  [facility] [address] [place], [place], [place] Phone: [phone]   Fax:  [phone]  Name: Jeff Chandler MRN: Jeff Chandler Date of Birth: 12-07-1975

## 2019-05-29 ENCOUNTER — Other Ambulatory Visit: Payer: Self-pay | Admitting: Orthopedic Surgery

## 2019-05-29 NOTE — Telephone Encounter (Signed)
Pt is s/p an ORIF pilon fx 12/2018 please advise if you want to refill medication.

## 2019-05-29 NOTE — Telephone Encounter (Signed)
Dr Sharol Given I would like a refusal, thanks. Sorry but we can not refuse on our end.

## 2019-05-29 NOTE — Telephone Encounter (Signed)
Dr Sharol Given, Can you please refuse this medication? You have refilled this last week, thanks.

## 2019-05-29 NOTE — Telephone Encounter (Signed)
Jeff Chandler patient °

## 2019-05-29 NOTE — Telephone Encounter (Signed)
rx written 12/2

## 2019-05-29 NOTE — Telephone Encounter (Signed)
rx written 12/2 

## 2019-05-31 ENCOUNTER — Other Ambulatory Visit: Payer: Self-pay

## 2019-05-31 ENCOUNTER — Ambulatory Visit: Payer: Medicaid Other | Admitting: Physical Therapy

## 2019-05-31 ENCOUNTER — Other Ambulatory Visit: Payer: Self-pay | Admitting: Orthopedic Surgery

## 2019-05-31 ENCOUNTER — Encounter: Payer: Self-pay | Admitting: Physical Therapy

## 2019-05-31 DIAGNOSIS — M25671 Stiffness of right ankle, not elsewhere classified: Secondary | ICD-10-CM | POA: Diagnosis not present

## 2019-05-31 DIAGNOSIS — R2689 Other abnormalities of gait and mobility: Secondary | ICD-10-CM

## 2019-05-31 DIAGNOSIS — M25571 Pain in right ankle and joints of right foot: Secondary | ICD-10-CM

## 2019-05-31 NOTE — Therapy (Signed)
[place], [place], [address] Phone: [phone]   Fax:  [phone]  Physical Therapy Treatment  Patient Details  Name: Jeff Chandler MRN: [id number] Date of Birth: [birth year] Referring Provider (PT): Dr Meridee Score   Encounter Date: 05/31/2019  PT End of Session - 05/31/19 1727    Visit Number  2    Number of Visits  4    Date for PT Re-Evaluation  06/22/19    Authorization Type  Medicaid    Authorization Time Period  05/30/19-06/19/19    Authorization - Visit Number  1    Authorization - Number of Visits  3    PT Start Time  3295    PT Stop Time  1801    PT Time Calculation (min)  48 min    Activity Tolerance  Patient tolerated treatment well    Behavior During Therapy  [facility] for tasks assessed/performed       History reviewed. No pertinent past medical history.  Past Surgical History:  Procedure Laterality Date  . FRACTURE SURGERY    . ORIF ANKLE FRACTURE Right 12/24/2018   Procedure: OPEN REDUCTION INTERNAL FIXATION (ORIF) PILON FRACTURE;  Surgeon: Newt Minion, MD;  Location: [facility];  Service: Orthopedics;  Laterality: Right;    There were no vitals filed for this visit.  Subjective Assessment - 05/31/19 1800    Subjective  Some soreness with progressing weightbearing right RLE as well as HEP performance otherwise no new complaints this PM.    Currently in Pain?  Yes    Pain Score  3     Pain Location  Ankle    Pain Orientation  Right    Pain Descriptors / Indicators  Aching;Sore    Pain Type  Chronic pain    Pain Onset  More than a month ago    Pain Frequency  Intermittent    Aggravating Factors   worse at night, standing and walking    Pain Relieving Factors  rest    Effect of Pain on Daily Activities  limits tolerance for prolonged standing and ambulation, sleep disturbance                       [facility] Adult PT Treatment/Exercise - 05/31/19 0001      Exercises    Exercises  Knee/Hip;Ankle      Knee/Hip Exercises: Seated   Long Arc Quad  AROM;Strengthening;Right;2 sets;10 reps    Long Arc Quad Weight  4 lbs.    Hamstring Curl  AROM;Strengthening;Right;2 sets;10 reps    Hamstring Limitations  Red Theraband      Knee/Hip Exercises: Supine   Bridges  AROM;Strengthening;Both;15 reps    Bridges Limitations  legs over reversed incline wedge    Straight Leg Raises  AROM;Right;15 reps      Manual Therapy   Manual therapy comments  ankle PROM focus DF      Ankle Exercises: Standing   Other Standing Ankle Exercises  standing lateral weightshifts on Airex x 20, marches on Airex x 20, fw/rev weightshifts (no Airex x20)    Other Standing Ankle Exercises  Romberg eyes closed on Airex feet together 20 sec x 3      Ankle Exercises: Seated   BAPS  Sitting;Level 1;15 reps   x 15 ea. way CW/CCW   Other Seated Ankle Exercises  seated ankle PF/DF and EV/IV with right foot on wooden rocker board x 20 reps ea.  Ankle Exercises: Stretches   Gastroc Stretch  3 reps;30 seconds    Gastroc Stretch Limitations  supine manual stretch      Ankle Exercises: Supine   T-Band  HEP review and practice Theraband ankle 4-way wuth cues to avoid compensatory hip rotation              PT Education - 05/31/19 1802    Education Details  HEP review, POC    Person(s) Educated  Patient    Methods  Explanation;Demonstration;Tactile cues;Verbal cues    Comprehension  Verbalized understanding;Returned demonstration;Verbal cues required;Tactile cues required       PT Short Term Goals - 05/25/19 0848      PT SHORT TERM GOAL #1   Title  Patient will increase passive DF to 10 degrees    Baseline  5 degrees    Time  3    Period  Weeks    Status  New    Target Date  06/15/19      PT SHORT TERM GOAL #2   Title  Patient will increase active DF to 5 degrees    Baseline  to neutral    Time  3    Period  Weeks    Status  New    Target Date  06/15/19      PT SHORT  TERM GOAL #3   Title  Patient will increase ankle IV, EV and DF to 5/5    Baseline  4+/5 each    Time  3    Period  Weeks    Status  New    Target Date  06/15/19               Plan - 05/31/19 1809    Clinical Impression Statement  Progressed standing activities with gentle weightshifting and proprioceptive challenges with Romberg otherwise primary focus seated and supine ankle ROM and stretches given pain with weightbearing. Added strengthening (open chain) in hip and knee to address associated proximal weakness s/p pilon fx. Reviewed HEP with min cues required to avoid compensatory hip and knee ROM with Theraband 4-way.    Personal Factors and Comorbidities  Comorbidity 1    Comorbidities  old right femur fx    Examination-Activity Limitations  Squat;Stand;Stairs    Examination-Participation Restrictions  Driving;Yard Work;Cleaning;Shop    Stability/Clinical Decision Making  Stable/Uncomplicated    Clinical Decision Making  Low    Rehab Potential  Good    PT Frequency  1x / week    PT Duration  4 weeks    PT Treatment/Interventions  ADLs/Self Care Home Management;Iontophoresis 4mg /ml Dexamethasone;Moist Heat;Ultrasound;Gait training;Stair training;Therapeutic activities;Therapeutic exercise;Patient/family education;Manual techniques;Passive range of motion;Dry needling;Taping    PT Next Visit Plan  gentle standing exercises with weightshifts and marches gradually progress CKC activities as tolerated, continue seated + supine open chain strengthening, ROM and genlte stretches as tolerated    PT Home Exercise Plan  ankle 4 way t-band; reviewed light ankle DF strengthening    Consulted and Agree with Plan of Care  Patient       Patient will benefit from skilled therapeutic intervention in order to improve the following deficits and impairments:  Abnormal gait, Difficulty walking, Decreased activity tolerance, Pain, Decreased range of motion, Decreased strength, Decreased  mobility  Visit Diagnosis: Stiffness of right ankle, not elsewhere classified  Pain in right ankle and joints of right foot  Other abnormalities of gait and mobility     Problem List Patient Active Problem List  Diagnosis Date Noted  . Displaced pilon fracture of right tibia, subsequent encounter for closed fracture with routine healing 12/24/2018  . Displaced pilon fracture of right tibia, initial encounter for closed fracture     Lazarus Gowda, PT, DPT 05/31/19 6:12 PM  [facility] [address] [place], [place], [address] Phone: [phone]   Fax:  [phone]  Name: Jeff Chandler MRN: [id number] Date of Birth: 05/26/1976

## 2019-05-31 NOTE — Telephone Encounter (Signed)
Duda patient °

## 2019-05-31 NOTE — Telephone Encounter (Signed)
12/24/18 s/p ORIF pilon fx do you wish to refill?

## 2019-06-01 ENCOUNTER — Ambulatory Visit (INDEPENDENT_AMBULATORY_CARE_PROVIDER_SITE_OTHER): Payer: Medicaid Other | Admitting: Physician Assistant

## 2019-06-01 ENCOUNTER — Other Ambulatory Visit: Payer: Self-pay

## 2019-06-01 ENCOUNTER — Encounter: Payer: Self-pay | Admitting: Physician Assistant

## 2019-06-01 ENCOUNTER — Ambulatory Visit (INDEPENDENT_AMBULATORY_CARE_PROVIDER_SITE_OTHER): Payer: Medicaid Other

## 2019-06-01 VITALS — Ht 64.0 in | Wt 174.0 lb

## 2019-06-01 DIAGNOSIS — S82871D Displaced pilon fracture of right tibia, subsequent encounter for closed fracture with routine healing: Secondary | ICD-10-CM

## 2019-06-01 NOTE — Progress Notes (Signed)
Office Visit Note   Patient: Jeff Chandler           Date of Birth: 1976/05/16           MRN: [id number] Visit Date: 06/01/2019              Requested by: No referring provider defined for this encounter. PCP: Patient, No Pcp Per  Chief Complaint  Patient presents with  . Right Ankle - Routine Post Op, Follow-up    S/p 12/24/2018 ORIF Pilon Fx      HPI: The patient is 5 months s/p ORIF Right Pilon fracture.  He is doing well in physical therapy and would like to be pushed harder and PT the swelling is going down.  He is wearing a compression sock.  The only time he has pain is at night  Assessment & Plan: Visit Diagnoses:  1. Displaced pilon fracture of right tibia, subsequent encounter for closed fracture with routine healing   2. Closed displaced pilon fracture of right tibia with routine healing, subsequent encounter     Plan: We will continue to do physical therapy for 1 more month focused on proprioceptive and functional strengthening.  He will follow-up for final visit in 1 month with x-rays  Follow-Up Instructions: No follow-ups on file.   Ortho Exam  Patient is alert, oriented, no adenopathy, well-dressed, normal affect, normal respiratory effort.  Right ankle: Well-healed surgical incision ankle range of motion has improved and he easily gets to 10 degrees past neutral with dorsiflexion pulses intact swelling minimal.  No cellulitis compartments are soft  Imaging: No results found. No images are attached to the encounter.  Labs: No results found for: HGBA1C, ESRSEDRATE, CRP, LABURIC, REPTSTATUS, GRAMSTAIN, CULT, LABORGA   No results found for: ALBUMIN, PREALBUMIN, LABURIC  No results found for: MG No results found for: VD25OH  No results found for: PREALBUMIN CBC EXTENDED Latest Ref Rng & Units 12/24/2018  WBC 4.0 - 10.5 K/uL 16.9(H)  RBC 4.22 - 5.81 MIL/uL 5.71  HGB 13.0 - 17.0 g/dL 09.2  HCT 33.0 - 07.6 % 48.1  PLT 150 - 400 K/uL 376  NEUTROABS 1.7 -  7.7 K/uL 12.8(H)  LYMPHSABS 0.7 - 4.0 K/uL 2.6     Body mass index is 29.87 kg/m.  Orders:  Orders Placed This Encounter  Procedures  . XR Ankle Complete Right  . Ambulatory referral to Physical Therapy   No orders of the defined types were placed in this encounter.    Procedures: No procedures performed  Clinical Data: No additional findings.  ROS:  All other systems negative, except as noted in the HPI. Review of Systems  Objective: Vital Signs: Ht 5\' 4"  (1.626 m)   Wt 174 lb (78.9 kg)   BMI 29.87 kg/m   Specialty Comments:  No specialty comments available.  PMFS History: Patient Active Problem List   Diagnosis Date Noted  . Displaced pilon fracture of right tibia, subsequent encounter for closed fracture with routine healing 12/24/2018  . Displaced pilon fracture of right tibia, initial encounter for closed fracture    No past medical history on file.  No family history on file.  Past Surgical History:  Procedure Laterality Date  . FRACTURE SURGERY    . ORIF ANKLE FRACTURE Right 12/24/2018   Procedure: OPEN REDUCTION INTERNAL FIXATION (ORIF) PILON FRACTURE;  Surgeon: 02/24/2019, MD;  Location: [facility];  Service: Orthopedics;  Laterality: Right;   Social History   Occupational History  .  Not on file  Tobacco Use  . Smoking status: Current Every Day Smoker  . Smokeless tobacco: Never Used  Substance and Sexual Activity  . Alcohol use: Never  . Drug use: Yes    Types: Marijuana  . Sexual activity: Not on file

## 2019-06-02 NOTE — Telephone Encounter (Signed)
Mary Anne please advise, thanks 

## 2019-06-07 ENCOUNTER — Ambulatory Visit: Payer: Medicaid Other | Admitting: Physical Therapy

## 2019-06-07 ENCOUNTER — Encounter: Payer: Self-pay | Admitting: Physical Therapy

## 2019-06-07 ENCOUNTER — Other Ambulatory Visit: Payer: Self-pay

## 2019-06-07 DIAGNOSIS — M25671 Stiffness of right ankle, not elsewhere classified: Secondary | ICD-10-CM | POA: Diagnosis not present

## 2019-06-07 DIAGNOSIS — R2689 Other abnormalities of gait and mobility: Secondary | ICD-10-CM

## 2019-06-07 DIAGNOSIS — M25571 Pain in right ankle and joints of right foot: Secondary | ICD-10-CM

## 2019-06-07 NOTE — Therapy (Signed)
[place], [place], [address] Phone: [phone]   Fax:  [phone]  Physical Therapy Treatment  Patient Details  Name: Jeff Chandler MRN: [id number] Date of Birth: February 04, 1976 Referring Provider (PT): Dr Meridee Score   Encounter Date: 06/07/2019  PT End of Session - 06/07/19 1557    Visit Number  3    Number of Visits  4    Date for PT Re-Evaluation  06/22/19    Authorization Type  Medicaid    Authorization Time Period  05/30/19-06/19/19    Authorization - Visit Number  2    Authorization - Number of Visits  3    PT Start Time  1540    PT Stop Time  1624    PT Time Calculation (min)  44 min    Activity Tolerance  Patient tolerated treatment well    Behavior During Therapy  [facility] for tasks assessed/performed       History reviewed. No pertinent past medical history.  Past Surgical History:  Procedure Laterality Date  . FRACTURE SURGERY    . ORIF ANKLE FRACTURE Right 12/24/2018   Procedure: OPEN REDUCTION INTERNAL FIXATION (ORIF) PILON FRACTURE;  Surgeon: Newt Minion, MD;  Location: [place];  Service: Orthopedics;  Laterality: Right;    There were no vitals filed for this visit.  Subjective Assessment - 06/07/19 1543    Subjective  Pt. had follow up with orthopedist 06/01/19 with instructions/new PT order to progress functional strengthening and proprioceptive challenges. Pain about a 3/10 right ankle this AM.    Currently in Pain?  Yes    Pain Score  3     Pain Location  Ankle    Pain Orientation  Right    Pain Descriptors / Indicators  Aching;Sore    Pain Type  Chronic pain    Pain Onset  More than a month ago    Pain Frequency  Intermittent    Aggravating Factors   standing and walking, worse at night    Pain Relieving Factors  rest    Effect of Pain on Daily Activities  limits positional tolerance and ability for prolonged standing and ambulation, causes sleep disturbance.         [facility]  PT Assessment - 06/07/19 0001      AROM   Right Ankle Dorsiflexion  3      PROM   Right Ankle Dorsiflexion  7      Strength   Right Ankle Dorsiflexion  5/5    Right Ankle Inversion  4+/5    Right Ankle Eversion  4+/5                   [facility] Adult PT Treatment/Exercise - 06/07/19 0001      Exercises   Exercises  Knee/Hip;Ankle      Knee/Hip Exercises: Standing   Forward Lunges Limitations  partial front lunge x 10 reps with right UE support on counter    Forward Step Up  Right;2 sets;10 reps;Step Height: 4"    Functional Squat Limitations  partial squat at counter 2x10    Rocker Board  2 minutes    Rocker Board Limitations  1 min ea. side to side and fw/rev dynamic balance on blue board      Ankle Exercises: Stretches   Gastroc Stretch  3 reps;30 seconds    Gastroc Stretch Limitations  standing stretch at counter      Ankle Exercises: Aerobic   Nustep  L3 x 5 min UE/LE      Ankle Exercises: Standing   Rebounder  feet together on Airex x 30 throws 1000 g ball    Other Standing Ankle Exercises  Partial tandem stance 20 sec x 3             PT Education - 06/07/19 1618    Education Details  HEP updates, balance components    Person(s) Educated  Patient    Methods  Explanation;Demonstration;Verbal cues;Handout    Comprehension  Verbalized understanding;Returned demonstration       PT Short Term Goals - 06/07/19 1620      PT SHORT TERM GOAL #1   Title  Patient will increase passive DF to 10 degrees    Baseline  7    Time  3    Period  Weeks    Status  On-going    Target Date  06/15/19      PT SHORT TERM GOAL #2   Title  Patient will increase active DF to 5 degrees    Baseline  3 deg    Time  3    Period  Weeks    Status  On-going    Target Date  06/15/19      PT SHORT TERM GOAL #3   Title  Patient will increase ankle IV, EV and DF to 5/5    Baseline  5/5 DF, 4+/5 EV/IV    Time  3    Period  Weeks    Status  Partially Met    Target Date   06/15/19               Plan - 06/07/19 1601    Clinical Impression Statement  Progressed more closed-chain/functional strengthening as well as proprioceptive challenges today-mild soreness but progression overall well-tolerated. Pt. will need continued PT for further progress to progress functional abilities for community ambulation and IADLs.    Personal Factors and Comorbidities  Comorbidity 1    Comorbidities  old right femur fx    Examination-Activity Limitations  Squat;Stand;Stairs    Examination-Participation Restrictions  Driving;Yard Work;Cleaning;Shop    Stability/Clinical Decision Making  Stable/Uncomplicated    Clinical Decision Making  Low    Rehab Potential  Good    PT Frequency  1x / week    PT Duration  4 weeks    PT Treatment/Interventions  ADLs/Self Care Home Management;Iontophoresis 60m/ml Dexamethasone;Moist Heat;Ultrasound;Gait training;Stair training;Therapeutic activities;Therapeutic exercise;Patient/family education;Manual techniques;Passive range of motion;Dry needling;Taping    PT Next Visit Plan  recertification/request more visits next session, continue/progress closed chain strengthening and balance/proprioceptive challenges as tolerated pending pain, ankle ROM/stretching as needed    PT Home Exercise Plan  ankle 4 way t-band; reviewed light ankle DF strengthening, partial squats, step ups, partial lunge, Romberg on cushion vs. SLS for balance    Consulted and Agree with Plan of Care  Patient       Patient will benefit from skilled therapeutic intervention in order to improve the following deficits and impairments:  Abnormal gait, Difficulty walking, Decreased activity tolerance, Pain, Decreased range of motion, Decreased strength, Decreased mobility  Visit Diagnosis: Pain in right ankle and joints of right foot  Stiffness of right ankle, not elsewhere classified  Other abnormalities of gait and mobility     Problem List Patient Active Problem  List   Diagnosis Date Noted  . Displaced pilon fracture of right tibia, subsequent encounter for closed fracture with routine healing 12/24/2018  . Displaced pilon fracture of  right tibia, initial encounter for closed fracture    Beaulah Dinning, PT, DPT 06/07/19 4:24 PM  [place], [place], [address] Phone: [phone]   Fax:  [phone]  Name: Jeff Chandler MRN: [id number] Date of Birth: April 21, 1976

## 2019-06-13 ENCOUNTER — Ambulatory Visit: Payer: Medicaid Other | Admitting: Physical Therapy

## 2019-06-13 ENCOUNTER — Other Ambulatory Visit: Payer: Self-pay

## 2019-06-13 ENCOUNTER — Encounter: Payer: Self-pay | Admitting: Physical Therapy

## 2019-06-13 DIAGNOSIS — R2689 Other abnormalities of gait and mobility: Secondary | ICD-10-CM

## 2019-06-13 DIAGNOSIS — M25571 Pain in right ankle and joints of right foot: Secondary | ICD-10-CM

## 2019-06-13 DIAGNOSIS — M25671 Stiffness of right ankle, not elsewhere classified: Secondary | ICD-10-CM

## 2019-06-13 NOTE — Therapy (Signed)
[facility] [address] [place], [place], [address] Phone: [phone]   Fax:  [phone]  Physical Therapy Treatment  Patient Details  Name: Jeff Chandler MRN: [id number] Date of Birth: Nov 04, 1975 Referring Provider (PT): Dr Aldean Baker   Encounter Date: 06/13/2019  PT End of Session - 06/13/19 1554    Visit Number  4    Number of Visits  7    Date for PT Re-Evaluation  07/18/19    Authorization Type  Medicaid    Authorization Time Period  05/30/19-06/19/19    Authorization - Visit Number  3    Authorization - Number of Visits  3    PT Start Time  1545    PT Stop Time  1626    PT Time Calculation (min)  41 min    Activity Tolerance  Patient tolerated treatment well    Behavior During Therapy  [facility] for tasks assessed/performed       History reviewed. No pertinent past medical history.  Past Surgical History:  Procedure Laterality Date  . FRACTURE SURGERY    . ORIF ANKLE FRACTURE Right 12/24/2018   Procedure: OPEN REDUCTION INTERNAL FIXATION (ORIF) PILON FRACTURE;  Surgeon: Nadara Mustard, MD;  Location: [facility] OR;  Service: Orthopedics;  Laterality: Right;    There were no vitals filed for this visit.  Subjective Assessment - 06/13/19 1550    Subjective  Patient reports it gets a little sore with the exercises but overall it i doing well. It is just a little sore today.    How long can you stand comfortably?  Swells after a while    How long can you walk comfortably?  increased swelling with walking    Diagnostic tests  X-ray: good alignment; healing well    Patient Stated Goals  to have less pain    Currently in Pain?  Yes    Pain Score  3     Pain Location  Ankle    Pain Orientation  Right    Pain Descriptors / Indicators  Aching    Pain Type  Chronic pain    Pain Onset  More than a month ago    Pain Frequency  Intermittent    Aggravating Factors   standing and walking    Pain Relieving Factors  rest    Effect  of Pain on Daily Activities  limits prolonged standing and walking         [facility] PT Assessment - 06/13/19 0001      AROM   Right Ankle Dorsiflexion  8      PROM   Right Ankle Dorsiflexion  13      Strength   Right Ankle Dorsiflexion  5/5    Right Ankle Inversion  4+/5    Right Ankle Eversion  4/5                   [facility] Adult PT Treatment/Exercise - 06/13/19 0001      Knee/Hip Exercises: Standing   Forward Lunges Limitations  partial front lunge x 10 reps with right UE support on counter    Forward Step Up  Right;2 sets;10 reps;Step Height: 4"    Functional Squat Limitations  partial squat at counter 2x10 with right leg forward       Manual Therapy   Manual therapy comments  gentle PROM into DF       Ankle Exercises: Aerobic   Nustep  L3 x 5 min UE/LE  Ankle Exercises: Standing   Rebounder  feet together on Airex x 30 throws 1000 g ball    Other Standing Ankle Exercises  Partial tandem stance 20 sec x 3      Ankle Exercises: Supine   T-Band  blue band x20 4 way                PT Short Term Goals - 06/13/19 1639      PT SHORT TERM GOAL #1   Title  Patient will increase passive DF to 10 degrees    Baseline  13    Time  3    Period  Weeks    Status  Achieved    Target Date  06/15/19      PT SHORT TERM GOAL #2   Title  Patient will increase active DF to 5 degrees    Baseline  7    Time  3    Period  Weeks    Status  Achieved    Target Date  06/15/19      PT SHORT TERM GOAL #3   Title  Patient will increase ankle IV, EV and DF to 5/5    Baseline  5/5 DF, 4+/5 EV/IV    Time  3    Period  Weeks    Status  On-going    Target Date  06/15/19        PT Long Term Goals - 06/13/19 1646      PT LONG TERM GOAL #1   Title  Patient will ambualte 1 mile without self reported pain    Baseline  can only walk about 1000'    Time  6    Period  Weeks    Status  New    Target Date  07/25/19      PT LONG TERM GOAL #2   Title  Patient  will bend down to pick object off the floor without pain    Time  6    Period  Weeks    Status  New    Target Date  07/25/19            Plan - 06/13/19 1606    Clinical Impression Statement  Patient has made good progreess. his active range was measured at 7 degrees of DF today. he has somepain with active inversion but his strength is improving. He reports pain in his shin a    Personal Factors and Comorbidities  Comorbidity 1    Comorbidities  old right femur fx    Examination-Activity Limitations  Squat;Stand;Stairs    Stability/Clinical Decision Making  Stable/Uncomplicated    PT Frequency  1x / week    PT Duration  3 weeks   per medicaid guidlines   PT Treatment/Interventions  ADLs/Self Care Home Management;Iontophoresis 4mg /ml Dexamethasone;Moist Heat;Ultrasound;Gait training;Stair training;Therapeutic activities;Therapeutic exercise;Patient/family education;Manual techniques;Passive range of motion;Dry needling;Taping    PT Next Visit Plan  recertification/request more visits next session, continue/progress closed chain strengthening and balance/proprioceptive challenges as tolerated pending pain, ankle ROM/stretching as needed    PT Home Exercise Plan  ankle 4 way t-band; reviewed light ankle DF strengthening, partial squats, step ups, partial lunge, Romberg on cushion vs. SLS for balance    Consulted and Agree with Plan of Care  Patient       Patient will benefit from skilled therapeutic intervention in order to improve the following deficits and impairments:  Abnormal gait, Difficulty walking, Decreased activity tolerance, Pain, Decreased range of motion, Decreased strength,  Decreased mobility  Visit Diagnosis: Pain in right ankle and joints of right foot  Stiffness of right ankle, not elsewhere classified  Other abnormalities of gait and mobility     Problem List Patient Active Problem List   Diagnosis Date Noted  . Displaced pilon fracture of right tibia,  subsequent encounter for closed fracture with routine healing 12/24/2018  . Displaced pilon fracture of right tibia, initial encounter for closed fracture     [name] PT DPT 06/13/2019, 4:48 PM  [place], [place], [address] Phone: [phone]   Fax:  [phone]  Name: Jeff Chandler MRN: [id number] Date of Birth: [birth year]

## 2019-06-29 ENCOUNTER — Encounter: Payer: Self-pay | Admitting: Physical Therapy

## 2019-06-29 ENCOUNTER — Ambulatory Visit: Payer: Medicaid Other | Attending: Orthopedic Surgery | Admitting: Physical Therapy

## 2019-06-29 ENCOUNTER — Other Ambulatory Visit: Payer: Self-pay

## 2019-06-29 DIAGNOSIS — M25671 Stiffness of right ankle, not elsewhere classified: Secondary | ICD-10-CM | POA: Insufficient documentation

## 2019-06-29 DIAGNOSIS — R2689 Other abnormalities of gait and mobility: Secondary | ICD-10-CM | POA: Insufficient documentation

## 2019-06-29 DIAGNOSIS — M25571 Pain in right ankle and joints of right foot: Secondary | ICD-10-CM

## 2019-06-30 ENCOUNTER — Encounter: Payer: Self-pay | Admitting: Physical Therapy

## 2019-06-30 NOTE — Therapy (Signed)
[facility] [address] [place], [place], [address] Phone: [phone]   Fax:  [phone]  Physical Therapy Treatment  Patient Details  Name: Jeff Chandler MRN: [id number] Date of Birth: 10/04/1975 Referring Provider (PT): Dr Aldean Baker   Encounter Date: 06/29/2019  PT End of Session - 06/30/19 1026    Visit Number  5    Number of Visits  7    Authorization Type  Medicaid    Authorization Time Period  05/30/19-06/19/19    Authorization - Visit Number  3    Authorization - Number of Visits  3    PT Start Time  1545    PT Stop Time  1615   being sent back to MD for further evaluation   PT Time Calculation (min)  30 min    Activity Tolerance  Patient tolerated treatment well    Behavior During Therapy  [facility] for tasks assessed/performed       History reviewed. No pertinent past medical history.  Past Surgical History:  Procedure Laterality Date  . FRACTURE SURGERY    . ORIF ANKLE FRACTURE Right 12/24/2018   Procedure: OPEN REDUCTION INTERNAL FIXATION (ORIF) PILON FRACTURE;  Surgeon: Nadara Mustard, MD;  Location: [facility] OR;  Service: Orthopedics;  Laterality: Right;    There were no vitals filed for this visit.  Subjective Assessment - 06/29/19 1549    Subjective  Patient tried to get his son away from a dog and twitted his ankle. He is now having significant pain. He reports it is better then a few days ago but it is still really sore.    How long can you stand comfortably?  Swells after a while    How long can you walk comfortably?  increased swelling with walking    Diagnostic tests  X-ray: good alignment; healing well    Pain Score  7     Pain Orientation  Right    Pain Descriptors / Indicators  Aching    Pain Type  Chronic pain    Pain Onset  More than a month ago    Pain Frequency  Intermittent    Aggravating Factors   standing and walking    Pain Relieving Factors  rest    Effect of Pain on Daily Activities  limits  prolonged                       [facility] Adult PT Treatment/Exercise - 06/30/19 0001      Knee/Hip Exercises: Aerobic   Nustep  3 min but reported increased pain L1       Modalities   Modalities  Vasopneumatic      Vasopneumatic   Number Minutes Vasopneumatic   15 minutes    Vasopnuematic Location   Ankle    Vasopneumatic Pressure  Low    Vasopneumatic Temperature   34      Manual Therapy   Manual Therapy  Edema management    Manual therapy comments  gentle PROM into DF and anterior drawer glide but low tolerance     Edema Management  genetle edema massgae but low tolerance              PT Education - 06/30/19 1025    Education Details  go back to MD to get checked out, enefits of game ready    Person(s) Educated  Patient    Methods  Explanation;Demonstration;Tactile cues;Verbal cues    Comprehension  Verbalized understanding;Returned  demonstration;Verbal cues required;Tactile cues required       PT Short Term Goals - 06/13/19 1639      PT SHORT TERM GOAL #1   Title  Patient will increase passive DF to 10 degrees    Baseline  13    Time  3    Period  Weeks    Status  Achieved    Target Date  06/15/19      PT SHORT TERM GOAL #2   Title  Patient will increase active DF to 5 degrees    Baseline  7    Time  3    Period  Weeks    Status  Achieved    Target Date  06/15/19      PT SHORT TERM GOAL #3   Title  Patient will increase ankle IV, EV and DF to 5/5    Baseline  5/5 DF, 4+/5 EV/IV    Time  3    Period  Weeks    Status  On-going    Target Date  06/15/19        PT Long Term Goals - 06/13/19 1646      PT LONG TERM GOAL #1   Title  Patient will ambualte 1 mile without self reported pain    Baseline  can only walk about 1000'    Time  6    Period  Weeks    Status  New    Target Date  07/25/19      PT LONG TERM GOAL #2   Title  Patient will bend down to pick object off the floor without pain    Time  6    Period  Weeks    Status   New    Target Date  07/25/19            Plan - 06/30/19 1027    Clinical Impression Statement  treatment was limited today. Therapy attempted light ROM and edema mangement but appeared to be causing a significant increase in pain. the patient will be seen by the MD on Monday. If he checks out OK we will continue withexercise progression. he had been making good progress. He was having difficulty weight bearing today. He had decreased swelling after vasopnematic treatment.    Personal Factors and Comorbidities  Comorbidity 1    Comorbidities  old right femur fx    Examination-Activity Limitations  Squat;Stand;Stairs    Examination-Participation Restrictions  Driving;Yard Work;Cleaning;Shop    Stability/Clinical Decision Making  Stable/Uncomplicated    Rehab Potential  Good    PT Frequency  1x / week    PT Duration  3 weeks    PT Treatment/Interventions  ADLs/Self Care Home Management;Iontophoresis 4mg /ml Dexamethasone;Moist Heat;Ultrasound;Gait training;Stair training;Therapeutic activities;Therapeutic exercise;Patient/family education;Manual techniques;Passive range of motion;Dry needling;Taping    PT Next Visit Plan  recertification/request more visits next session, continue/progress closed chain strengthening and balance/proprioceptive challenges as tolerated pending pain, ankle ROM/stretching as needed    PT Home Exercise Plan  ankle 4 way t-band; reviewed light ankle DF strengthening, partial squats, step ups, partial lunge, Romberg on cushion vs. SLS for balance    Consulted and Agree with Plan of Care  Patient       Patient will benefit from skilled therapeutic intervention in order to improve the following deficits and impairments:  Abnormal gait, Difficulty walking, Decreased activity tolerance, Pain, Decreased range of motion, Decreased strength, Decreased mobility  Visit Diagnosis: Pain in right ankle and joints of right foot  Stiffness of right ankle, not elsewhere  classified  Other abnormalities of gait and mobility     Problem List Patient Active Problem List   Diagnosis Date Noted  . Displaced pilon fracture of right tibia, subsequent encounter for closed fracture with routine healing 12/24/2018  . Displaced pilon fracture of right tibia, initial encounter for closed fracture     [name] PT DPT  06/30/2019, 10:32 AM  [place], [place], [address] Phone: [phone]   Fax:  [phone]  Name: Jeff Chandler MRN: [id number] Date of Birth: 1975-08-11

## 2019-07-03 ENCOUNTER — Other Ambulatory Visit: Payer: Self-pay

## 2019-07-03 ENCOUNTER — Encounter: Payer: Self-pay | Admitting: Physician Assistant

## 2019-07-03 ENCOUNTER — Ambulatory Visit (INDEPENDENT_AMBULATORY_CARE_PROVIDER_SITE_OTHER): Payer: Medicaid Other

## 2019-07-03 ENCOUNTER — Ambulatory Visit (INDEPENDENT_AMBULATORY_CARE_PROVIDER_SITE_OTHER): Payer: Medicaid Other | Admitting: Orthopedic Surgery

## 2019-07-03 VITALS — Ht 64.0 in | Wt 174.0 lb

## 2019-07-03 DIAGNOSIS — S82871D Displaced pilon fracture of right tibia, subsequent encounter for closed fracture with routine healing: Secondary | ICD-10-CM | POA: Diagnosis not present

## 2019-07-03 NOTE — Progress Notes (Signed)
Office Visit Note   Patient: Jeff Chandler           Date of Birth: Feb 22, 1976           MRN: [id number] Visit Date: 07/03/2019              Requested by: No referring provider defined for this encounter. PCP: Patient, No Pcp Per  Chief Complaint  Patient presents with  . Right Ankle - Follow-up    12/24/18 ORIF pilon fx       HPI: Patient is a 44 year old gentleman who is 6 months status post open reduction internal fixation pilon fracture right ankle.  Patient is currently full weightbearing in regular shoes he is still going to physical therapy he states he feels well has no concerns.  Assessment & Plan: Visit Diagnoses:  1. Displaced pilon fracture of right tibia, subsequent encounter for closed fracture with routine healing     Plan: Recommend continue dorsiflexion of the ankle and strengthening as well as scar massage.  Follow-Up Instructions: Return if symptoms worsen or fail to improve.   Ortho Exam  Patient is alert, oriented, no adenopathy, well-dressed, normal affect, normal respiratory effort. Examination patient has excellent range of motion of the ankle with dorsiflexion of about 20 degrees past neutral.  Patient's foot is neurovascular intact.  There is no redness no cellulitis the calf is soft nontender.  The hardware is not tender to palpation.  Imaging: XR Ankle Complete Right  Result Date: 07/03/2019 2 view radiographs of the right ankle shows stable internal fixation the pilon fracture is completely healed the mortise is congruent there is demineralization of the bones in the foot.  No images are attached to the encounter.  Labs: No results found for: HGBA1C, ESRSEDRATE, CRP, LABURIC, REPTSTATUS, GRAMSTAIN, CULT, LABORGA   No results found for: ALBUMIN, PREALBUMIN, LABURIC  No results found for: MG No results found for: VD25OH  No results found for: PREALBUMIN CBC EXTENDED Latest Ref Rng & Units 12/24/2018  WBC 4.0 - 10.5 K/uL 16.9(H)  RBC 4.22  - 5.81 MIL/uL 5.71  HGB 13.0 - 17.0 g/dL 16.0  HCT 39.0 - 52.0 % 48.1  PLT 150 - 400 K/uL 376  NEUTROABS 1.7 - 7.7 K/uL 12.8(H)  LYMPHSABS 0.7 - 4.0 K/uL 2.6     Body mass index is 29.87 kg/m.  Orders:  Orders Placed This Encounter  Procedures  . XR Ankle Complete Right   No orders of the defined types were placed in this encounter.    Procedures: No procedures performed  Clinical Data: No additional findings.  ROS:  All other systems negative, except as noted in the HPI. Review of Systems  Objective: Vital Signs: Ht 5\' 4"  (1.626 m)   Wt 174 lb (78.9 kg)   BMI 29.87 kg/m   Specialty Comments:  No specialty comments available.  PMFS History: Patient Active Problem List   Diagnosis Date Noted  . Displaced pilon fracture of right tibia, subsequent encounter for closed fracture with routine healing 12/24/2018  . Displaced pilon fracture of right tibia, initial encounter for closed fracture    History reviewed. No pertinent past medical history.  History reviewed. No pertinent family history.  Past Surgical History:  Procedure Laterality Date  . FRACTURE SURGERY    . ORIF ANKLE FRACTURE Right 12/24/2018   Procedure: OPEN REDUCTION INTERNAL FIXATION (ORIF) PILON FRACTURE;  Surgeon: Newt Minion, MD;  Location: [place];  Service: Orthopedics;  Laterality: Right;   Social History  Occupational History  . Not on file  Tobacco Use  . Smoking status: Current Every Day Smoker  . Smokeless tobacco: Never Used  Substance and Sexual Activity  . Alcohol use: Never  . Drug use: Yes    Types: Marijuana  . Sexual activity: Not on file

## 2019-07-06 ENCOUNTER — Encounter: Payer: Self-pay | Admitting: Physical Therapy

## 2019-07-06 ENCOUNTER — Ambulatory Visit: Payer: Medicaid Other | Admitting: Physical Therapy

## 2019-07-06 ENCOUNTER — Other Ambulatory Visit: Payer: Self-pay

## 2019-07-06 DIAGNOSIS — R2689 Other abnormalities of gait and mobility: Secondary | ICD-10-CM

## 2019-07-06 DIAGNOSIS — M25671 Stiffness of right ankle, not elsewhere classified: Secondary | ICD-10-CM

## 2019-07-06 DIAGNOSIS — M25571 Pain in right ankle and joints of right foot: Secondary | ICD-10-CM | POA: Diagnosis not present

## 2019-07-06 NOTE — Therapy (Signed)
[facility] [address] [place], [place], [address] Phone: [phone]   Fax:  [phone]  Physical Therapy Treatment  Patient Details  Name: Jeff Chandler MRN: [id number] Date of Birth: 1975/11/05 Referring Provider (PT): Dr Aldean Baker   Encounter Date: 07/06/2019  PT End of Session - 07/06/19 1559    Visit Number  6    Number of Visits  7    Date for PT Re-Evaluation  07/18/19    Authorization Type  Medicaid    Authorization Time Period  05/30/19-06/19/19    PT Start Time  1545    PT Stop Time  1626    PT Time Calculation (min)  41 min    Activity Tolerance  Patient tolerated treatment well    Behavior During Therapy  [facility] for tasks assessed/performed       History reviewed. No pertinent past medical history.  Past Surgical History:  Procedure Laterality Date  . FRACTURE SURGERY    . ORIF ANKLE FRACTURE Right 12/24/2018   Procedure: OPEN REDUCTION INTERNAL FIXATION (ORIF) PILON FRACTURE;  Surgeon: Nadara Mustard, MD;  Location: [facility] OR;  Service: Orthopedics;  Laterality: Right;    There were no vitals filed for this visit.  Subjective Assessment - 07/06/19 1550    Subjective  Patient reports his ankle is doing better. It felt better over the weekend. He saw the MD who x-rayed it. He reports it is doing better.    How long can you stand comfortably?  Swells after a while    How long can you walk comfortably?  increased swelling with walking    Diagnostic tests  X-ray: good alignment; healing well    Patient Stated Goals  to have less pain    Currently in Pain?  Yes    Pain Score  1     Pain Location  Ankle         [facility] PT Assessment - 07/06/19 0001      AROM   Right Ankle Dorsiflexion  8      PROM   Right Ankle Dorsiflexion  10                   [facility] Adult PT Treatment/Exercise - 07/06/19 0001      Knee/Hip Exercises: Standing   Heel Raises Limitations  x20     Lateral Step Up  20  reps;Step Height: 4"    Forward Step Up  Right;2 sets;10 reps;Step Height: 4"    Functional Squat Limitations  partial squat at counter 2x10 with right leg forward     Other Standing Knee Exercises  step onto air-ex 2x10 forward and lateral       Manual Therapy   Manual Therapy  Edema management    Manual therapy comments  PROM into DF; gentle distraction ; gnelte AP glides       Ankle Exercises: Supine   T-Band  4 way green band x20                PT Short Term Goals - 07/06/19 1607      PT SHORT TERM GOAL #1   Title  Patient will increase passive DF to 10 degrees    Baseline  13    Time  3    Period  Weeks    Status  Achieved    Target Date  06/15/19      PT SHORT TERM GOAL #2   Title  Patient will  increase active DF to 5 degrees    Baseline  7    Time  3    Period  Weeks    Status  Achieved    Target Date  06/15/19      PT SHORT TERM GOAL #3   Title  Patient will increase ankle IV, EV and DF to 5/5    Baseline  5/5 DF, 4+/5 EV/IV    Time  3    Period  Weeks    Status  On-going    Target Date  06/15/19        PT Long Term Goals - 06/13/19 1646      PT LONG TERM GOAL #1   Title  Patient will ambualte 1 mile without self reported pain    Baseline  can only walk about 1000'    Time  6    Period  Weeks    Status  New    Target Date  07/25/19      PT LONG TERM GOAL #2   Title  Patient will bend down to pick object off the floor without pain    Time  6    Period  Weeks    Status  New    Target Date  07/25/19            Plan - 07/06/19 1606    Clinical Impression Statement  Patient did much better today. Therapy was able to add back inhigher level exercises. His range is improving. He is demonstraing improved PF strength. Therapy will continue to progress as tolerated.    Comorbidities  old right femur fx    Examination-Activity Limitations  Squat;Stand;Stairs    Examination-Participation Restrictions  Driving;Yard Work;Cleaning;Shop     Stability/Clinical Decision Making  Stable/Uncomplicated    Clinical Decision Making  Low    Rehab Potential  Good    PT Frequency  1x / week    PT Duration  3 weeks    PT Treatment/Interventions  ADLs/Self Care Home Management;Iontophoresis 4mg /ml Dexamethasone;Moist Heat;Ultrasound;Gait training;Stair training;Therapeutic activities;Therapeutic exercise;Patient/family education;Manual techniques;Passive range of motion;Dry needling;Taping    PT Next Visit Plan  recertification/request more visits next session, continue/progress closed chain strengthening and balance/proprioceptive challenges as tolerated pending pain, ankle ROM/stretching as needed    PT Home Exercise Plan  ankle 4 way t-band; reviewed light ankle DF strengthening, partial squats, step ups, partial lunge, Romberg on cushion vs. SLS for balance    Consulted and Agree with Plan of Care  Patient       Patient will benefit from skilled therapeutic intervention in order to improve the following deficits and impairments:  Abnormal gait, Difficulty walking, Decreased activity tolerance, Pain, Decreased range of motion, Decreased strength, Decreased mobility  Visit Diagnosis: Pain in right ankle and joints of right foot  Stiffness of right ankle, not elsewhere classified  Other abnormalities of gait and mobility     Problem List Patient Active Problem List   Diagnosis Date Noted  . Displaced pilon fracture of right tibia, subsequent encounter for closed fracture with routine healing 12/24/2018  . Displaced pilon fracture of right tibia, initial encounter for closed fracture     [name]  PT DPT  07/06/2019, 5:05 PM  [place], [place], [address] Phone: [phone]   Fax:  [phone]  Name: Jeff Chandler MRN: [id number] Date of Birth: 11-Mar-1976

## 2019-07-13 ENCOUNTER — Ambulatory Visit: Payer: Medicaid Other | Admitting: Physical Therapy

## 2019-07-13 ENCOUNTER — Other Ambulatory Visit: Payer: Self-pay

## 2019-07-13 DIAGNOSIS — M25571 Pain in right ankle and joints of right foot: Secondary | ICD-10-CM | POA: Diagnosis not present

## 2019-07-13 DIAGNOSIS — M25671 Stiffness of right ankle, not elsewhere classified: Secondary | ICD-10-CM

## 2019-07-13 DIAGNOSIS — R2689 Other abnormalities of gait and mobility: Secondary | ICD-10-CM

## 2019-07-14 ENCOUNTER — Encounter: Payer: Self-pay | Admitting: Physical Therapy

## 2019-07-14 NOTE — Therapy (Signed)
[facility] Outpatient Rehabilitation [facility] [address] [place], [place], [address] Phone: [phone]   Fax:  [phone]  Physical Therapy Treatment/Recert   Patient Details  Name: Jeff Chandler MRN: [id number] Date of Birth: 1976-03-23 Referring Provider (PT): Dr Aldean Baker   Encounter Date: 07/13/2019  PT End of Session - 07/13/19 1548    Visit Number  7    Number of Visits  13    Date for PT Re-Evaluation  08/25/19    Authorization Type  Medicaid    Authorization Time Period  05/30/19-06/19/19    Authorization - Visit Number  7    Authorization - Number of Visits  13    PT Start Time  1547    PT Stop Time  1627    PT Time Calculation (min)  40 min    Activity Tolerance  Patient tolerated treatment well    Behavior During Therapy  [facility] for tasks assessed/performed       History reviewed. No pertinent past medical history.  Past Surgical History:  Procedure Laterality Date  . FRACTURE SURGERY    . ORIF ANKLE FRACTURE Right 12/24/2018   Procedure: OPEN REDUCTION INTERNAL FIXATION (ORIF) PILON FRACTURE;  Surgeon: Nadara Mustard, MD;  Location: [facility] OR;  Service: Orthopedics;  Laterality: Right;    There were no vitals filed for this visit.  Subjective Assessment - 07/13/19 1551    Subjective  Patient reports he has beendoing well. He feels pain at times. He feels like he is at about 60% of what he was able to do before. he would like to continue working on it.    How long can you stand comfortably?  Swells after a while    How long can you walk comfortably?  increased swelling with walking    Diagnostic tests  X-ray: good alignment; healing well    Patient Stated Goals  to have less pain    Currently in Pain?  Yes    Pain Score  2     Pain Location  Ankle    Pain Orientation  Right    Pain Descriptors / Indicators  Aching    Pain Onset  More than a month ago    Pain Frequency  Intermittent    Aggravating Factors   standing and walking    Pain  Relieving Factors  rest    Effect of Pain on Daily Activities  limits aboulity to stand for a prolonged time         [facility] PT Assessment - 07/14/19 0001      PROM   Right Ankle Dorsiflexion  10      Strength   Right Ankle Dorsiflexion  5/5    Right Ankle Inversion  5/5    Right Ankle Eversion  5/5                   [facility] Adult PT Treatment/Exercise - 07/14/19 0001      Knee/Hip Exercises: Standing   Heel Raises Limitations  x20     Lateral Step Up  20 reps;Step Height: 4"    Forward Step Up  Right;2 sets;10 reps;Step Height: 4"    Functional Squat Limitations  partial squat at counter 2x10 with right leg forward     Other Standing Knee Exercises  step onto air-ex 2x10 forward and lateral     Other Standing Knee Exercises  rocker board forward and sideways control x10 each way       Manual  Therapy   Manual Therapy  Edema management    Manual therapy comments  PROM into DF; gentle distraction ; gnelte AP glides       Ankle Exercises: Supine   T-Band  4 way green band x20              PT Education - 07/13/19 1553    Education Details  reviewed POC going forward    Person(s) Educated  Patient    Methods  Explanation;Demonstration;Tactile cues;Verbal cues    Comprehension  Verbalized understanding;Returned demonstration;Verbal cues required;Tactile cues required       PT Short Term Goals - 07/14/19 1056      PT SHORT TERM GOAL #1   Title  Patient will increase passive DF to 10 degrees    Baseline  13    Time  3    Period  Weeks    Target Date  06/15/19      PT SHORT TERM GOAL #2   Title  Patient will increase active DF to 5 degrees    Baseline  10    Time  3    Period  Weeks    Status  Achieved    Target Date  06/15/19      PT SHORT TERM GOAL #3   Title  Patient will increase ankle IV, EV and DF to 5/5    Baseline  5/5 PF not tested    Time  3    Period  Weeks    Status  Achieved    Target Date  06/15/19        PT Long Term Goals -  07/14/19 1057      PT LONG TERM GOAL #1   Title  Patient will ambualte 1 mile without self reported pain    Baseline  can only walk about 2000    Time  6    Period  Weeks    Status  On-going    Target Date  08/25/19      PT LONG TERM GOAL #2   Title  Patient will bend down to pick object off the floor without pain    Baseline  pain picking item off the floor    Time  6    Period  Weeks    Status  On-going            Plan - 07/13/19 1553    Clinical Impression Statement  Patient is making good progress. he had a minor set back in his POC when he twisted his ankle but otherwise has been making good progress. His strength and range have improved significantly to 4+/5 for DF/EV/ and IV. He continues to have low levels of pain with functional activity. He still feels like he is having difficulty with direction changes. he would benefit from further skilled therapy 1W6 to reach functional goals.    Personal Factors and Comorbidities  Comorbidity 1    Comorbidities  old right femur fx    Examination-Activity Limitations  Squat;Stand;Stairs    Examination-Participation Restrictions  Driving;Yard Work;Cleaning;Shop    Stability/Clinical Decision Making  Stable/Uncomplicated    Rehab Potential  Good    PT Frequency  1x / week    PT Duration  3 weeks    PT Treatment/Interventions  ADLs/Self Care Home Management;Iontophoresis 4mg /ml Dexamethasone;Moist Heat;Ultrasound;Gait training;Stair training;Therapeutic activities;Therapeutic exercise;Patient/family education;Manual techniques;Passive range of motion;Dry needling;Taping    PT Next Visit Plan  recertification/request more visits next session, continue/progress closed chain strengthening and balance/proprioceptive challenges  as tolerated pending pain, ankle ROM/stretching as needed    PT Home Exercise Plan  ankle 4 way t-band; reviewed light ankle DF strengthening, partial squats, step ups, partial lunge, Romberg on cushion vs. SLS for  balance    Consulted and Agree with Plan of Care  Patient       Patient will benefit from skilled therapeutic intervention in order to improve the following deficits and impairments:  Abnormal gait, Difficulty walking, Decreased activity tolerance, Pain, Decreased range of motion, Decreased strength, Decreased mobility  Visit Diagnosis: Pain in right ankle and joints of right foot  Stiffness of right ankle, not elsewhere classified  Other abnormalities of gait and mobility     Problem List Patient Active Problem List   Diagnosis Date Noted  . Displaced pilon fracture of right tibia, subsequent encounter for closed fracture with routine healing 12/24/2018  . Displaced pilon fracture of right tibia, initial encounter for closed fracture     [name] PT DPT  07/14/2019, 11:00 AM  [place], [place], [address] Phone: [phone]   Fax:  [phone]  Name: Jeff Chandler MRN: [id number] Date of Birth: 05/12/1976

## 2019-07-31 ENCOUNTER — Ambulatory Visit: Payer: Medicaid Other | Attending: Orthopedic Surgery | Admitting: Physical Therapy

## 2019-07-31 ENCOUNTER — Other Ambulatory Visit: Payer: Self-pay

## 2019-07-31 ENCOUNTER — Encounter: Payer: Self-pay | Admitting: Physical Therapy

## 2019-07-31 DIAGNOSIS — R2689 Other abnormalities of gait and mobility: Secondary | ICD-10-CM | POA: Diagnosis present

## 2019-07-31 DIAGNOSIS — M25671 Stiffness of right ankle, not elsewhere classified: Secondary | ICD-10-CM | POA: Insufficient documentation

## 2019-07-31 DIAGNOSIS — M25571 Pain in right ankle and joints of right foot: Secondary | ICD-10-CM | POA: Diagnosis not present

## 2019-08-01 ENCOUNTER — Encounter: Payer: Self-pay | Admitting: Physical Therapy

## 2019-08-01 NOTE — Therapy (Signed)
[facility] Outpatient Rehabilitation [facility] [address] [place], [place], [address] Phone: [phone]   Fax:  [phone]  Physical Therapy Treatment  Patient Details  Name: Jeff Chandler MRN: [id number] Date of Birth: 1976/02/18 Referring Provider (PT): Dr Aldean Baker   Encounter Date: 07/31/2019  PT End of Session - 07/31/19 1612    Visit Number  8    Number of Visits  13    Date for PT Re-Evaluation  08/25/19    Authorization Type  Medicaid    Authorization Time Period  05/30/19-06/19/19    Authorization - Visit Number  7    Authorization - Number of Visits  13    PT Start Time  1545    PT Stop Time  1624    PT Time Calculation (min)  39 min    Activity Tolerance  Patient tolerated treatment well    Behavior During Therapy  [facility] for tasks assessed/performed       History reviewed. No pertinent past medical history.  Past Surgical History:  Procedure Laterality Date  . FRACTURE SURGERY    . ORIF ANKLE FRACTURE Right 12/24/2018   Procedure: OPEN REDUCTION INTERNAL FIXATION (ORIF) PILON FRACTURE;  Surgeon: Nadara Mustard, MD;  Location: [facility] OR;  Service: Orthopedics;  Laterality: Right;    There were no vitals filed for this visit.  Subjective Assessment - 07/31/19 1551    Subjective  Patiewnt reports he was a little sore over the weekend. He reports his whole leg was sore. He thinks it may have been the weather. He is not having much pain this morning.    How long can you stand comfortably?  Swells after a while    How long can you walk comfortably?  increased swelling with walking    Diagnostic tests  X-ray: good alignment; healing well    Patient Stated Goals  to have less pain    Currently in Pain?  Yes    Pain Score  1     Pain Location  Ankle    Pain Orientation  Right    Pain Descriptors / Indicators  Aching    Pain Type  Chronic pain    Pain Onset  More than a month ago    Pain Frequency  Intermittent    Aggravating Factors   standing and  walking    Pain Relieving Factors  rest    Effect of Pain on Daily Activities  limits abiltiy to sytand and ambulate                       [facility] Adult PT Treatment/Exercise - 08/01/19 0001      Manual Therapy   Manual therapy comments  PROM into DF; gentle distraction ; gnelte AP glides       Ankle Exercises: Supine   T-Band  4 way green blue  x20       Ankle Exercises: Standing   Other Standing Ankle Exercises  8 inch step up x20; 6 inch lateral step up x20; heel raise x20; squat 25lb kettle bell x20; kettbel bell swing 15lb x20; step onto air-ex x20 fwd and lateral;       Ankle Exercises: Machines for Strengthening   Cybex Leg Press  40lbs x20              PT Education - 07/31/19 1611    Education Details  HEP, symptom maagement    Person(s) Educated  Patient    Methods  Explanation;Demonstration;Tactile cues;Verbal cues    Comprehension  Verbalized understanding;Returned demonstration;Verbal cues required;Tactile cues required       PT Short Term Goals - 08/01/19 1231      PT SHORT TERM GOAL #1   Title  Patient will increase passive DF to 10 degrees    Baseline  13    Time  3    Period  Weeks    Status  Achieved    Target Date  06/15/19      PT SHORT TERM GOAL #2   Title  Patient will increase active DF to 5 degrees    Baseline  10    Time  3    Period  Weeks    Status  Achieved    Target Date  06/15/19      PT SHORT TERM GOAL #3   Title  Patient will increase ankle IV, EV and DF to 5/5    Time  3    Period  Weeks    Status  Achieved    Target Date  06/15/19        PT Long Term Goals - 07/14/19 1057      PT LONG TERM GOAL #1   Title  Patient will ambualte 1 mile without self reported pain    Baseline  can only walk about 2000    Time  6    Period  Weeks    Status  On-going    Target Date  08/25/19      PT LONG TERM GOAL #2   Title  Patient will bend down to pick object off the floor without pain    Baseline  pain picking  item off the floor    Time  6    Period  Weeks    Status  On-going            Plan - 07/31/19 1616    Clinical Impression Statement  Therapy advanced step to 8 inches. He tolerated well. He tolerated stability exercises today with no increase in pain. His range was full perv visual inspection. Therapy will continue to advanace ther-ex as tolerated.    Personal Factors and Comorbidities  Comorbidity 1    Comorbidities  old right femur fx    Examination-Activity Limitations  Squat;Stand;Stairs    Stability/Clinical Decision Making  Stable/Uncomplicated    Rehab Potential  Good    PT Frequency  1x / week    PT Duration  3 weeks    PT Treatment/Interventions  ADLs/Self Care Home Management;Iontophoresis 4mg /ml Dexamethasone;Moist Heat;Ultrasound;Gait training;Stair training;Therapeutic activities;Therapeutic exercise;Patient/family education;Manual techniques;Passive range of motion;Dry needling;Taping    PT Next Visit Plan  recertification/request more visits next session, continue/progress closed chain strengthening and balance/proprioceptive challenges as tolerated pending pain, ankle ROM/stretching as needed    PT Home Exercise Plan  ankle 4 way t-band; reviewed light ankle DF strengthening, partial squats, step ups, partial lunge, Romberg on cushion vs. SLS for balance    Consulted and Agree with Plan of Care  Patient       Patient will benefit from skilled therapeutic intervention in order to improve the following deficits and impairments:  Abnormal gait, Difficulty walking, Decreased activity tolerance, Pain, Decreased range of motion, Decreased strength, Decreased mobility  Visit Diagnosis: Pain in right ankle and joints of right foot  Stiffness of right ankle, not elsewhere classified  Other abnormalities of gait and mobility     Problem List Patient Active Problem List   Diagnosis Date Noted  .  Displaced pilon fracture of right tibia, subsequent encounter for closed  fracture with routine healing 12/24/2018  . Displaced pilon fracture of right tibia, initial encounter for closed fracture     [name] PT DPT  08/01/2019, 12:33 PM  [place], [place], [address] Phone: [phone]   Fax:  [phone]  Name: Antwaun Buth MRN: [id number] Date of Birth: [birth year]

## 2019-08-07 ENCOUNTER — Ambulatory Visit: Payer: Medicaid Other | Admitting: Physical Therapy

## 2019-08-14 ENCOUNTER — Ambulatory Visit: Payer: Medicaid Other | Admitting: Physical Therapy

## 2019-08-14 ENCOUNTER — Other Ambulatory Visit: Payer: Self-pay

## 2019-08-14 ENCOUNTER — Encounter: Payer: Self-pay | Admitting: Physical Therapy

## 2019-08-14 DIAGNOSIS — M25571 Pain in right ankle and joints of right foot: Secondary | ICD-10-CM

## 2019-08-14 DIAGNOSIS — R2689 Other abnormalities of gait and mobility: Secondary | ICD-10-CM

## 2019-08-14 DIAGNOSIS — M25671 Stiffness of right ankle, not elsewhere classified: Secondary | ICD-10-CM

## 2019-08-15 NOTE — Therapy (Signed)
[facility] [address] [place], [place], [address] Phone: [phone]   Fax:  [phone]  Physical Therapy Treatment  Patient Details  Name: Jeff Chandler MRN: [id number] Date of Birth: [birth year] Referring Provider (PT): Dr Aldean Baker   Encounter Date: 08/14/2019  PT End of Session - 08/14/19 1559    Visit Number  9    Number of Visits  13    Date for PT Re-Evaluation  08/25/19    Authorization Type  Medicaid    Authorization Time Period  2-8-3/21    Authorization - Visit Number  1    Authorization - Number of Visits  6    PT Start Time  1550    PT Stop Time  1633    PT Time Calculation (min)  43 min    Activity Tolerance  Patient tolerated treatment well    Behavior During Therapy  [facility] for tasks assessed/performed       History reviewed. No pertinent past medical history.  Past Surgical History:  Procedure Laterality Date  . FRACTURE SURGERY    . ORIF ANKLE FRACTURE Right 12/24/2018   Procedure: OPEN REDUCTION INTERNAL FIXATION (ORIF) PILON FRACTURE;  Surgeon: Nadara Mustard, MD;  Location: [facility] OR;  Service: Orthopedics;  Laterality: Right;    There were no vitals filed for this visit.  Subjective Assessment - 08/14/19 1554    Subjective  Patient reports he is tired because he has been doing work. He comes in today continuing to not put as much weight on his right leg. He reports no isgnificant pain unless he is up on it for a long period of time.    How long can you stand comfortably?  Swells after a while    How long can you walk comfortably?  increased swelling with walking    Diagnostic tests  X-ray: good alignment; healing well    Patient Stated Goals  to have less pain    Currently in Pain?  Yes    Pain Score  2     Pain Location  Ankle    Pain Orientation  Right    Pain Descriptors / Indicators  Aching    Pain Type  Surgical pain    Pain Onset  More than a month ago    Pain Frequency  Intermittent    Aggravating Factors   when he dosent have his compression sock on    Pain Relieving Factors  compression sock    Effect of Pain on Daily Activities  limits ability to stand and ambualte                       [facility] Adult PT Treatment/Exercise - 08/15/19 0001      Knee/Hip Exercises: Stretches   Gastroc Stretch Limitations  standing mod cuing to not rock 3x20 sec hold       Knee/Hip Exercises: Standing   Heel Raises Limitations  x20     Lateral Step Up  20 reps;Step Height: 6"    Forward Step Up  Right;2 sets;10 reps;Step Height: 6"    Functional Squat Limitations  partial squat at counter 2x10 with right leg forward       Manual Therapy   Manual therapy comments  PROM into DF; gentle distraction ; gentle AP glides       Ankle Exercises: Standing   Other Standing Ankle Exercises  8 inch step up x20; 6 inch lateral step up x20; heel  raise x20; squat 25lb kettle bell x20; kettbel bell swing 15lb x20; step onto air-ex x20 fwd and lateral;       Ankle Exercises: Machines for Strengthening   Cybex Leg Press  40lbs x20              PT Education - 08/14/19 1558    Education Details  reviewed stretching    Person(s) Educated  Patient    Methods  Explanation;Demonstration;Tactile cues;Verbal cues    Comprehension  Returned demonstration;Verbalized understanding       PT Short Term Goals - 08/01/19 1231      PT SHORT TERM GOAL #1   Title  Patient will increase passive DF to 10 degrees    Baseline  13    Time  3    Period  Weeks    Status  Achieved    Target Date  06/15/19      PT SHORT TERM GOAL #2   Title  Patient will increase active DF to 5 degrees    Baseline  10    Time  3    Period  Weeks    Status  Achieved    Target Date  06/15/19      PT SHORT TERM GOAL #3   Title  Patient will increase ankle IV, EV and DF to 5/5    Time  3    Period  Weeks    Status  Achieved    Target Date  06/15/19        PT Long Term Goals - 07/14/19 1057      PT  LONG TERM GOAL #1   Title  Patient will ambualte 1 mile without self reported pain    Baseline  can only walk about 2000    Time  6    Period  Weeks    Status  On-going    Target Date  08/25/19      PT LONG TERM GOAL #2   Title  Patient will bend down to pick object off the floor without pain    Baseline  pain picking item off the floor    Time  6    Period  Weeks    Status  On-going            Plan - 08/15/19 1225    Clinical Impression Statement  Therapy assessed patients motion. He had 9 degrees of active DF. Therapy also adviaced his standing exercises. He reported minor pain with squating. he was given a standing stretch which helepd. Therapy will continue to progress advanced exercises. He had some difficulty with standingcone drill. he was given for home.    Personal Factors and Comorbidities  Comorbidity 1    Comorbidities  old right femur fx    Examination-Activity Limitations  Squat;Stand;Stairs    Examination-Participation Restrictions  Driving;Yard Work;Cleaning;Shop    Stability/Clinical Decision Making  Stable/Uncomplicated    Clinical Decision Making  Low    PT Treatment/Interventions  ADLs/Self Care Home Management;Iontophoresis 4mg /ml Dexamethasone;Moist Heat;Ultrasound;Gait training;Stair training;Therapeutic activities;Therapeutic exercise;Patient/family education;Manual techniques;Passive range of motion;Dry needling;Taping    PT Next Visit Plan  recertification/request more visits next session, continue/progress closed chain strengthening and balance/proprioceptive challenges as tolerated pending pain, ankle ROM/stretching as needed    PT Home Exercise Plan  ankle 4 way t-band; reviewed light ankle DF strengthening, partial squats, step ups, partial lunge, Romberg on cushion vs. SLS for balance    Consulted and Agree with Plan of Care  Patient  Patient will benefit from skilled therapeutic intervention in order to improve the following deficits and  impairments:  Abnormal gait, Difficulty walking, Decreased activity tolerance, Pain, Decreased range of motion, Decreased strength, Decreased mobility  Visit Diagnosis: Pain in right ankle and joints of right foot  Stiffness of right ankle, not elsewhere classified  Other abnormalities of gait and mobility     Problem List Patient Active Problem List   Diagnosis Date Noted  . Displaced pilon fracture of right tibia, subsequent encounter for closed fracture with routine healing 12/24/2018  . Displaced pilon fracture of right tibia, initial encounter for closed fracture     Dessie Coma 08/15/2019, 1:12 PM  [facility] [address] [place], [place], [address] Phone: [phone]   Fax:  [phone]  Name: Imanuel Pruiett MRN: [id number] Date of Birth: [birth year]

## 2019-08-21 ENCOUNTER — Ambulatory Visit: Payer: Medicaid Other | Attending: Orthopedic Surgery | Admitting: Physical Therapy

## 2019-08-21 ENCOUNTER — Telehealth: Payer: Self-pay | Admitting: Physical Therapy

## 2019-08-21 DIAGNOSIS — M25571 Pain in right ankle and joints of right foot: Secondary | ICD-10-CM | POA: Insufficient documentation

## 2019-08-21 DIAGNOSIS — R2689 Other abnormalities of gait and mobility: Secondary | ICD-10-CM | POA: Insufficient documentation

## 2019-08-21 DIAGNOSIS — M25671 Stiffness of right ankle, not elsewhere classified: Secondary | ICD-10-CM | POA: Insufficient documentation

## 2019-08-21 NOTE — Telephone Encounter (Signed)
Called patient regarding no-show visit. 1st number out of service. Second number was the wrong number.

## 2019-08-28 ENCOUNTER — Ambulatory Visit: Payer: Medicaid Other | Admitting: Physical Therapy

## 2019-08-28 ENCOUNTER — Encounter: Payer: Self-pay | Admitting: Physical Therapy

## 2019-08-28 ENCOUNTER — Other Ambulatory Visit: Payer: Self-pay

## 2019-08-28 DIAGNOSIS — M25571 Pain in right ankle and joints of right foot: Secondary | ICD-10-CM

## 2019-08-28 DIAGNOSIS — R2689 Other abnormalities of gait and mobility: Secondary | ICD-10-CM

## 2019-08-28 DIAGNOSIS — M25671 Stiffness of right ankle, not elsewhere classified: Secondary | ICD-10-CM | POA: Diagnosis present

## 2019-08-29 NOTE — Therapy (Addendum)
[place], [place], [address] Phone: [phone]   Fax:  [phone]  Physical Therapy Treatment/Discharge   Patient Details  Name: Jeff Chandler MRN: [id number] Date of Birth: 08-25-1975 Referring Provider (PT): Dr Meridee Score   Encounter Date: 08/28/2019  PT End of Session - 08/28/19 1552    Visit Number  10    Number of Visits  13    Date for PT Re-Evaluation  08/25/19    Authorization Type  Medicaid    Authorization Time Period  2-8-3/21    Authorization - Visit Number  1    Authorization - Number of Visits  6    PT Start Time  6606    PT Stop Time  1625    PT Time Calculation (min)  40 min    Activity Tolerance  Patient tolerated treatment well    Behavior During Therapy  [facility] for tasks assessed/performed       History reviewed. No pertinent past medical history.  Past Surgical History:  Procedure Laterality Date  . FRACTURE SURGERY    . ORIF ANKLE FRACTURE Right 12/24/2018   Procedure: OPEN REDUCTION INTERNAL FIXATION (ORIF) PILON FRACTURE;  Surgeon: Newt Minion, MD;  Location: [facility];  Service: Orthopedics;  Laterality: Right;    There were no vitals filed for this visit.  Subjective Assessment - 08/28/19 1550    Subjective  Patient has had some family issues he has been working through. He has had to drive to [place]. His ankle is holding up well.    How long can you stand comfortably?  Swells after a while    How long can you walk comfortably?  increased swelling with walking    Currently in Pain?  No/denies                       [facility] Adult PT Treatment/Exercise - 08/29/19 0001      Self-Care   Self-Care  Other Self-Care Comments    Other Self-Care Comments   reviewed POC going forward; Patient advised to continue to wpork on standing and single leg  stability exercises. Therapy also reviewed the use of RICE if his ankle continues to cause him problems.        Knee/Hip Exercises: Supine   Other Supine Knee/Hip Exercises  t-band 4 way blue;       Vasopneumatic   Number Minutes Vasopneumatic   15 minutes    Vasopnuematic Location   Ankle    Vasopneumatic Pressure  Low    Vasopneumatic Temperature   34      Manual Therapy   Manual therapy comments  PROM into DF; gentle distraction ; gentle AP glides       Ankle Exercises: Stretches   Gastroc Stretch Limitations  standing gastroc stretch 3x20 sec hold              PT Education - 08/28/19 1551    Education Details  HEP and symptom mangement    Person(s) Educated  Patient    Methods  Explanation;Tactile cues;Demonstration;Verbal cues    Comprehension  Verbalized understanding;Returned demonstration;Verbal cues required;Tactile cues required       PT Short Term Goals - 08/01/19 1231      PT SHORT TERM GOAL #1   Title  Patient will increase passive DF to 10 degrees    Baseline  13    Time  3    Period  Weeks  Status  Achieved    Target Date  06/15/19      PT SHORT TERM GOAL #2   Title  Patient will increase active DF to 5 degrees    Baseline  10    Time  3    Period  Weeks    Status  Achieved    Target Date  06/15/19      PT SHORT TERM GOAL #3   Title  Patient will increase ankle IV, EV and DF to 5/5    Time  3    Period  Weeks    Status  Achieved    Target Date  06/15/19        PT Long Term Goals - 07/14/19 1057      PT LONG TERM GOAL #1   Title  Patient will ambualte 1 mile without self reported pain    Baseline  can only walk about 2000    Time  6    Period  Weeks    Status  On-going    Target Date  08/25/19      PT LONG TERM GOAL #2   Title  Patient will bend down to pick object off the floor without pain    Baseline  pain picking item off the floor    Time  6    Period  Weeks    Status  On-going            Plan - 08/28/19 1609    Clinical Impression Statement  Patient has made excellet progreess. He has less of an antoaglic gait today. he  has been working on his stretches. His active DF was measured at 12 degrees. he is working on his wreight bearing exercises at home. He had a limited session today. Hre is independent with his HEP and needs to leave to drive to [place]. Patient requested game ready 2nd to minor swelling and that fact that he was going to hav a long drive.    Personal Factors and Comorbidities  Comorbidity 1    Comorbidities  old right femur fx    Examination-Activity Limitations  Squat;Stand;Stairs    Examination-Participation Restrictions  Driving;Yard Work;Cleaning;Shop    Stability/Clinical Decision Making  Stable/Uncomplicated    Clinical Decision Making  Low    Rehab Potential  Good    PT Frequency  1x / week    PT Duration  3 weeks    PT Treatment/Interventions  ADLs/Self Care Home Management;Iontophoresis 15m/ml Dexamethasone;Moist Heat;Ultrasound;Gait training;Stair training;Therapeutic activities;Therapeutic exercise;Patient/family education;Manual techniques;Passive range of motion;Dry needling;Taping    PT Next Visit Plan  recertification/request more visits next session, continue/progress closed chain strengthening and balance/proprioceptive challenges as tolerated pending pain, ankle ROM/stretching as needed    PT Home Exercise Plan  ankle 4 way t-band; reviewed light ankle DF strengthening, partial squats, step ups, partial lunge, Romberg on cushion vs. SLS for balance    Consulted and Agree with Plan of Care  Patient       Patient will benefit from skilled therapeutic intervention in order to improve the following deficits and impairments:  Abnormal gait, Difficulty walking, Decreased activity tolerance, Pain, Decreased range of motion, Decreased strength, Decreased mobility  Visit Diagnosis: Pain in right ankle and joints of right foot  Stiffness of right ankle, not elsewhere classified  Other abnormalities of gait and mobility     Problem List Patient Active Problem List   Diagnosis  Date Noted  . Displaced pilon fracture of right tibia, subsequent encounter  for closed fracture with routine healing 12/24/2018  . Displaced pilon fracture of right tibia, initial encounter for closed fracture     PHYSICAL THERAPY DISCHARGE SUMMARY  Visits from Start of Care: 10  Current functional level related to goals / functional outcomes: Improved pain and ability    Remaining deficits: Pain at times when he stands and walk   Education / Equipment: HEP   Plan: Patient agrees to discharge.  Patient goals were not met. Patient is being discharged due to meeting the stated rehab goals.  ?????      [name] PT DPT  08/29/2019, 11:16 AM  [facility] [address] [place], [place], [address] Phone: [phone]   Fax:  [phone]  Name: Jeff Chandler MRN: [id number] Date of Birth: 1975/07/10

## 2019-09-04 ENCOUNTER — Ambulatory Visit: Payer: Medicaid Other | Admitting: Physical Therapy

## 2021-06-14 IMAGING — CR PORTABLE RIGHT TIBIA AND FIBULA - 2 VIEW
3 series · 3 of 3 positions shown · non-contrast
Comparison: Earlier films, same date.

CLINICAL DATA: Postreduction films.

EXAM:
PORTABLE RIGHT TIBIA AND FIBULA - 2 VIEW

[AP (1 of 2)]
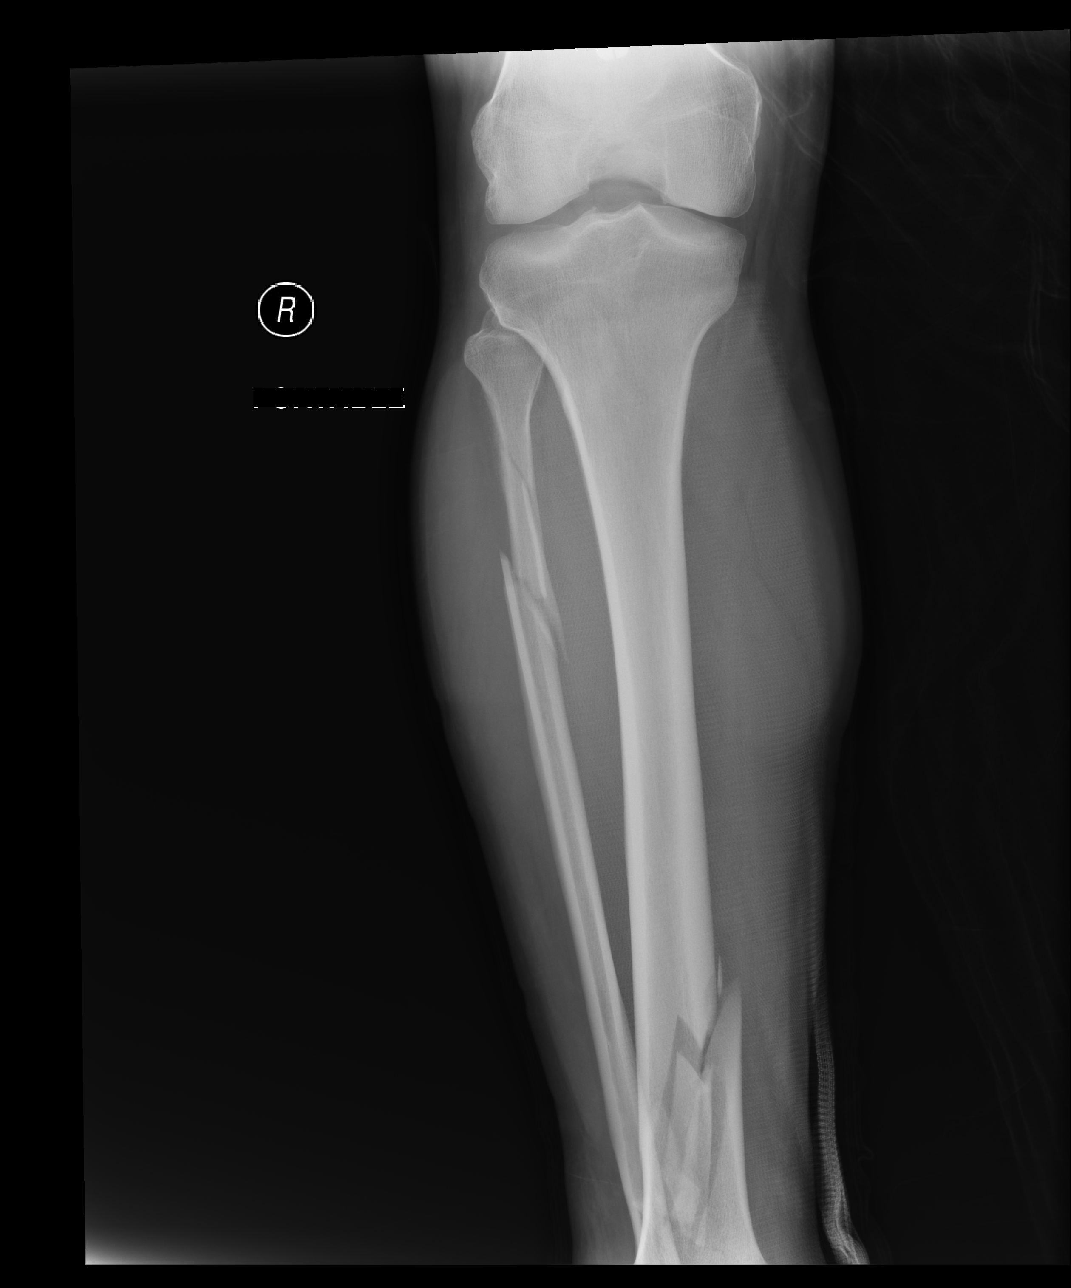

[AP (2 of 2)]
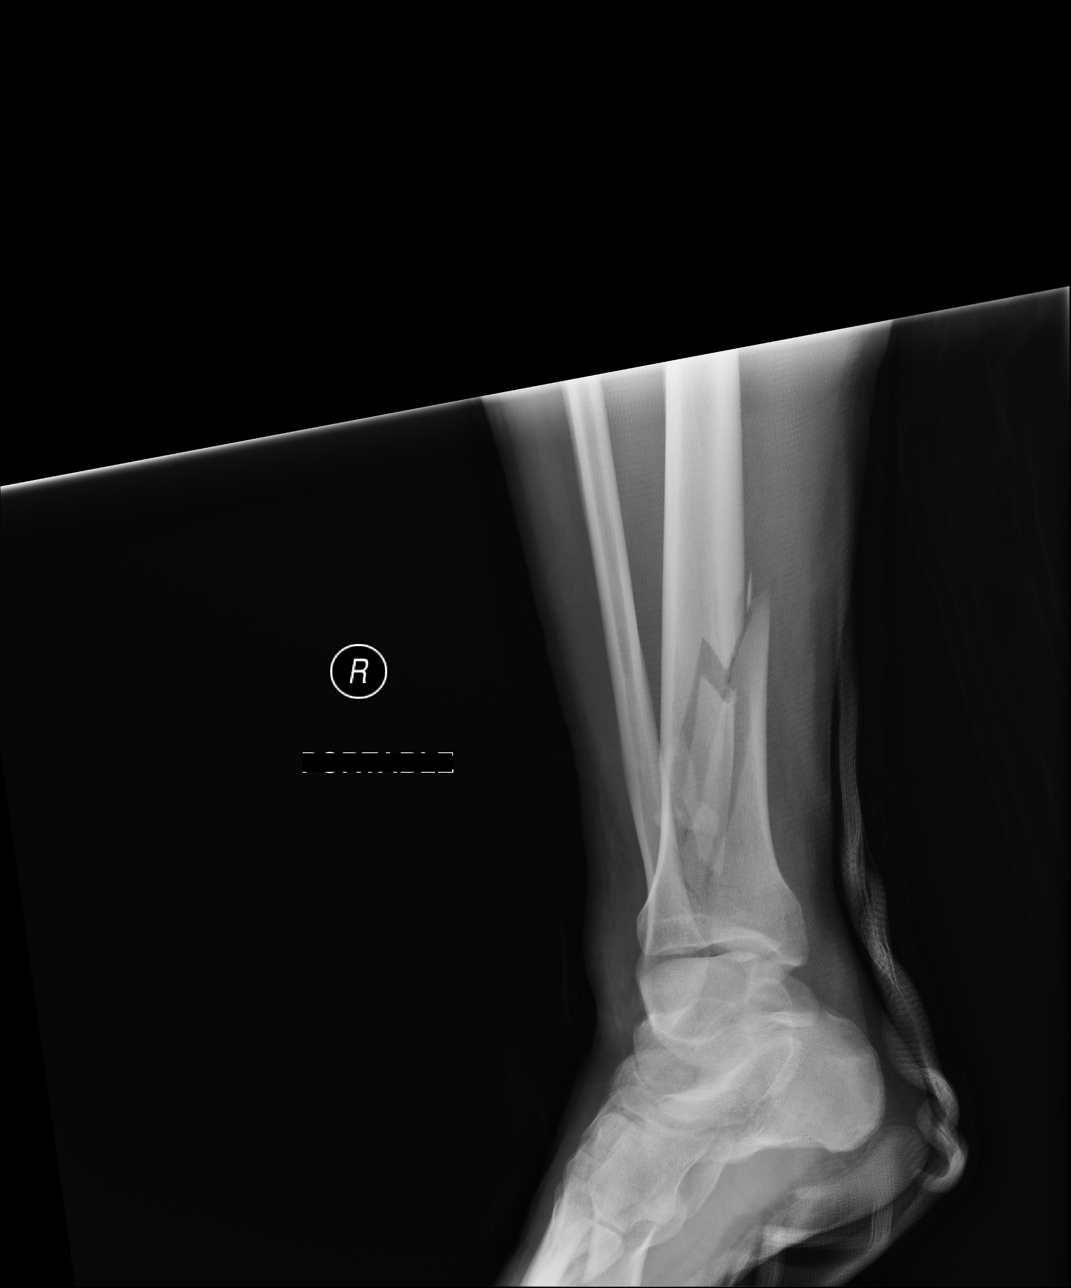

[lateral]
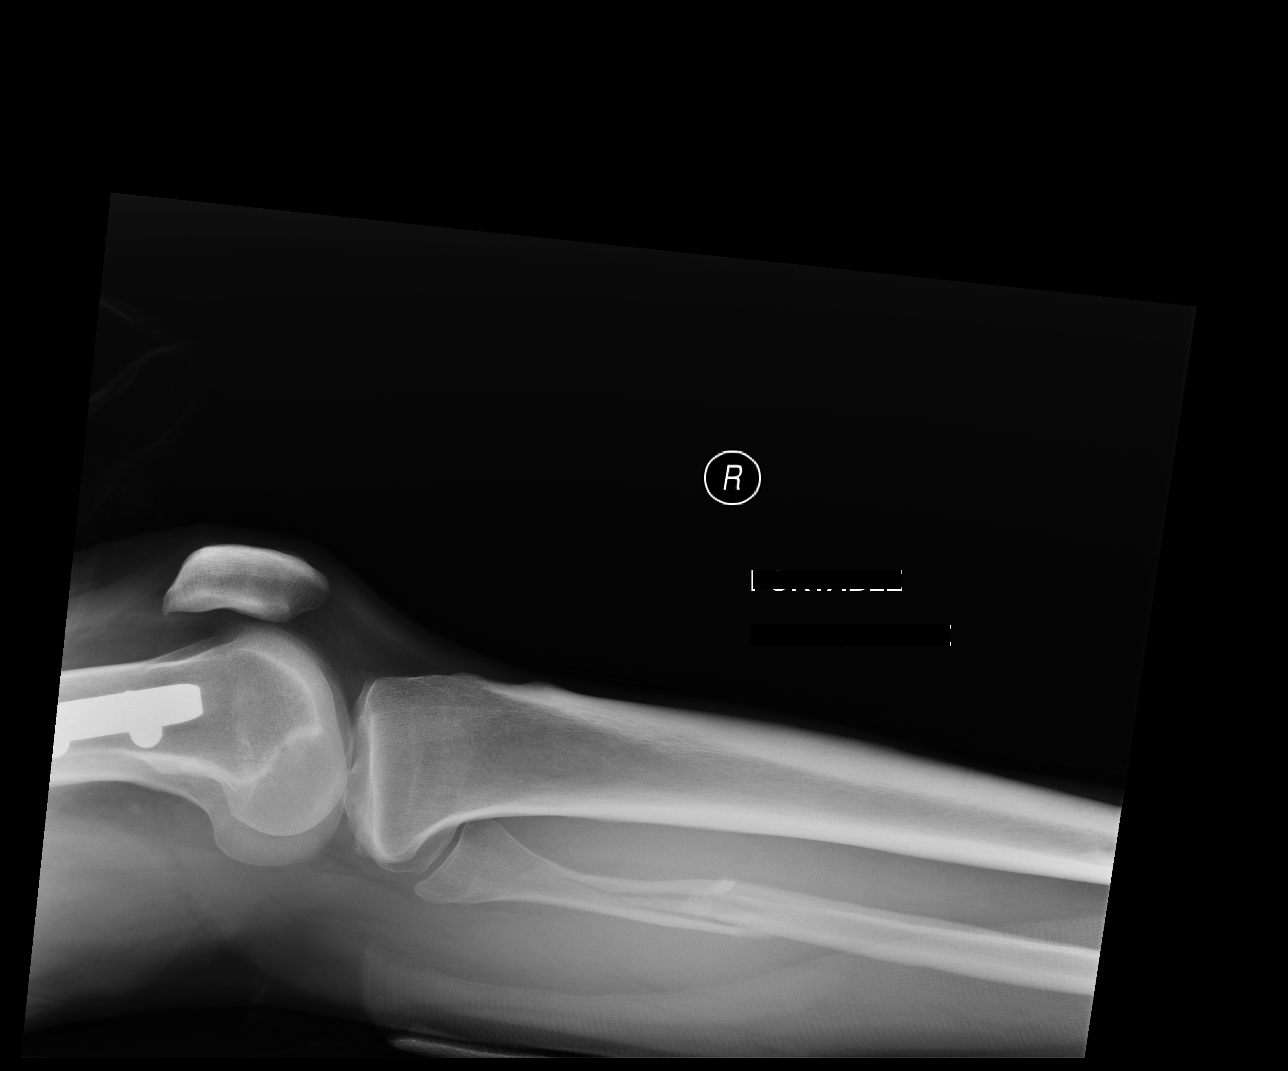

[3 of 3 positions shown; findings below may reference images not displayed]

FINDINGS: Severely comminuted fracture of the distal tibial shaft without
obvious intra-articular involvement. Improved position and alignment
post reduction and posterior splint placement.

Improved position and alignment spiral type fracture of the proximal
fibular shaft.
IMPRESSION: Slight improved position and alignment of the comminuted proximal
fibular and distal tibial fractures.

## 2022-08-12 ENCOUNTER — Ambulatory Visit
Admission: EM | Admit: 2022-08-12 | Discharge: 2022-08-12 | Disposition: A | Discharge status: Home / Self Care | Payer: Medicaid Other | Attending: Urgent Care | Admitting: Urgent Care

## 2022-08-12 DIAGNOSIS — A084 Viral intestinal infection, unspecified: Secondary | ICD-10-CM | POA: Diagnosis not present

## 2022-08-12 DIAGNOSIS — R197 Diarrhea, unspecified: Secondary | ICD-10-CM

## 2022-08-12 DIAGNOSIS — R112 Nausea with vomiting, unspecified: Secondary | ICD-10-CM | POA: Diagnosis not present

## 2022-08-12 MED ORDER — ACETAMINOPHEN 325 MG PO TABS: 650.0000 mg | ORAL_TABLET | Freq: Four times a day (QID) | ORAL | 0 refills | Status: AC | PRN

## 2022-08-12 MED ORDER — LOPERAMIDE HCL 2 MG PO CAPS: 2.0000 mg | ORAL_CAPSULE | Freq: Two times a day (BID) | ORAL | 0 refills | Status: AC | PRN

## 2022-08-12 MED ORDER — ONDANSETRON 8 MG PO TBDP: 8.0000 mg | ORAL_TABLET | Freq: Three times a day (TID) | ORAL | 0 refills | Status: AC | PRN

## 2022-08-12 NOTE — Discharge Instructions (Signed)

## 2022-08-12 NOTE — ED Provider Notes (Signed)
[facility]  Note:  This document was prepared using Systems analyst and may include unintentional dictation errors.  MRN: [id number] DOB: [birth year]  Subjective:   Jeff Chandler is a 48 y.o. male presenting for 3-day history of upper abdominal pain, nausea, vomiting, diarrhea. Had 4 episodes of vomiting, multiple bouts of diarrhea. No fever, bloody stools, hematemesis, recent antibiotic use, hospitalizations or long distance travel.  Has not eaten raw foods, drank unfiltered water.  No history of GI disorders including Crohn's, IBS, ulcerative colitis.   No chronic medications.    Allergies  Allergen Reactions   Hydrocodone-Acetaminophen Other (See Comments)    Patient states this medication makes him have nightmares    History reviewed. No pertinent past medical history.   Past Surgical History:  Procedure Laterality Date   FRACTURE SURGERY     ORIF ANKLE FRACTURE Right 12/24/2018   Procedure: OPEN REDUCTION INTERNAL FIXATION (ORIF) PILON FRACTURE;  Surgeon: Newt Minion, MD;  Location: [facility];  Service: Orthopedics;  Laterality: Right;    No family history on file.  Social History   Tobacco Use   Smoking status: Every Day    Types: Cigarettes   Smokeless tobacco: Never  Vaping Use   Vaping Use: Never used  Substance Use Topics   Alcohol use: Never   Drug use: Yes    Types: Marijuana    ROS   Objective:   Vitals: BP 123/86 (BP Location: Right Arm)   Pulse 73   Temp 98 F (36.7 C) (Oral)   Resp 18   SpO2 97%   Physical Exam Constitutional:      General: He is not in acute distress.    Appearance: Normal appearance. He is well-developed and normal weight. He is not ill-appearing, toxic-appearing or diaphoretic.  HENT:     Head: Normocephalic and atraumatic.     Right Ear: External ear normal.     Left Ear: External ear normal.     Nose: Nose normal.     Mouth/Throat:     Pharynx: Oropharynx is clear.  Eyes:      General: No scleral icterus.       Right eye: No discharge.        Left eye: No discharge.     Extraocular Movements: Extraocular movements intact.  Cardiovascular:     Rate and Rhythm: Normal rate.  Pulmonary:     Effort: Pulmonary effort is normal.  Abdominal:     General: Bowel sounds are increased. There is no distension.     Palpations: Abdomen is soft. There is no mass.     Tenderness: There is generalized abdominal tenderness (mild). There is no right CVA tenderness, left CVA tenderness, guarding or rebound.  Musculoskeletal:     Cervical back: Normal range of motion.  Neurological:     Mental Status: He is alert and oriented to person, place, and time.  Psychiatric:        Mood and Affect: Mood normal.        Behavior: Behavior normal.        Thought Content: Thought content normal.        Judgment: Judgment normal.     Assessment and Plan :   PDMP not reviewed this encounter.  1. Viral gastroenteritis   2. Nausea vomiting and diarrhea     No signs of an acute abdomen. Will manage for suspected viral gastroenteritis with supportive care.  Recommended patient hydrate well, eat light  meals and maintain electrolytes.  Will use Zofran and Imodium for nausea, vomiting and diarrhea. Counseled patient on potential for adverse effects with medications prescribed/recommended today, ER and return-to-clinic precautions discussed, patient verbalized understanding.    Jaynee Eagles, [place] 08/12/22 1147

## 2022-08-12 NOTE — ED Triage Notes (Signed)
Pt c/o mid abd pain, n/v/d "sulfa burps" x 3 days-NAD-steady gait

## 2023-01-24 ENCOUNTER — Emergency Department (HOSPITAL_COMMUNITY): Payer: Medicaid Other

## 2023-01-24 ENCOUNTER — Emergency Department (HOSPITAL_COMMUNITY)
Admission: EM | Admit: 2023-01-24 | Discharge: 2023-01-24 | Disposition: A | Discharge status: Home / Self Care | Payer: Medicaid Other | Attending: Emergency Medicine | Admitting: Emergency Medicine

## 2023-01-24 DIAGNOSIS — Z20822 Contact with and (suspected) exposure to covid-19: Secondary | ICD-10-CM

## 2023-01-24 DIAGNOSIS — E860 Dehydration: Secondary | ICD-10-CM

## 2023-01-24 DIAGNOSIS — R059 Cough, unspecified: Secondary | ICD-10-CM | POA: Diagnosis present

## 2023-01-24 DIAGNOSIS — U071 COVID-19: Secondary | ICD-10-CM | POA: Insufficient documentation

## 2023-01-24 LAB — CBC WITH DIFFERENTIAL/PLATELET
Abs Immature Granulocytes: 0.04 10*3/uL (ref 0.00–0.07)
Basophils Absolute: 0 10*3/uL (ref 0.0–0.1)
Basophils Relative: 0 %
Eosinophils Absolute: 0.1 10*3/uL (ref 0.0–0.5)
Eosinophils Relative: 1 %
HCT: 47.8 % (ref 39.0–52.0)
Hemoglobin: 16 g/dL (ref 13.0–17.0)
Immature Granulocytes: 0 %
Lymphocytes Relative: 16 %
Lymphs Abs: 1.5 10*3/uL (ref 0.7–4.0)
MCH: 28.1 pg (ref 26.0–34.0)
MCHC: 33.5 g/dL (ref 30.0–36.0)
MCV: 83.9 fL (ref 80.0–100.0)
Monocytes Absolute: 1.1 10*3/uL — ABNORMAL HIGH (ref 0.1–1.0)
Monocytes Relative: 11 %
Neutro Abs: 6.8 10*3/uL (ref 1.7–7.7)
Neutrophils Relative %: 72 %
Platelets: 239 10*3/uL (ref 150–400)
RBC: 5.7 MIL/uL (ref 4.22–5.81)
RDW: 13.9 % (ref 11.5–15.5)
WBC: 9.6 10*3/uL (ref 4.0–10.5)
nRBC: 0 % (ref 0.0–0.2)

## 2023-01-24 LAB — COMPREHENSIVE METABOLIC PANEL
ALT: 22 U/L (ref 0–44)
AST: 21 U/L (ref 15–41)
Albumin: 4 g/dL (ref 3.5–5.0)
Alkaline Phosphatase: 114 U/L (ref 38–126)
Anion gap: 10 (ref 5–15)
BUN: 15 mg/dL (ref 6–20)
CO2: 24 mmol/L (ref 22–32)
Calcium: 8.5 mg/dL — ABNORMAL LOW (ref 8.9–10.3)
Chloride: 105 mmol/L (ref 98–111)
Creatinine, Ser: 2 mg/dL — ABNORMAL HIGH (ref 0.61–1.24)
GFR, Estimated: 41 mL/min — ABNORMAL LOW (ref >60)
Glucose, Bld: 124 mg/dL — ABNORMAL HIGH (ref 70–99)
Potassium: 3.9 mmol/L (ref 3.5–5.1)
Sodium: 139 mmol/L (ref 135–145)
Total Bilirubin: 0.5 mg/dL (ref 0.3–1.2)
Total Protein: 7.1 g/dL (ref 6.5–8.1)

## 2023-01-24 LAB — ETHANOL: Alcohol, Ethyl (B): <10 mg/dL (ref <10)

## 2023-01-24 LAB — SARS CORONAVIRUS 2 BY RT PCR: SARS Coronavirus 2 by RT PCR: POSITIVE — AB

## 2023-01-24 MED ORDER — SODIUM CHLORIDE 0.9 % IV BOLUS
1000.0000 mL | Freq: Once | INTRAVENOUS | Status: AC
  Administered 2023-01-24: 1000 mL via INTRAVENOUS

## 2023-01-24 MED ORDER — DIPHENHYDRAMINE HCL 50 MG/ML IJ SOLN
25.0000 mg | Freq: Once | INTRAMUSCULAR | Status: AC
  Administered 2023-01-24: 25 mg via INTRAVENOUS
  Filled 2023-01-24: qty 1

## 2023-01-24 MED ORDER — METOCLOPRAMIDE HCL 5 MG/ML IJ SOLN
10.0000 mg | Freq: Once | INTRAMUSCULAR | Status: AC
  Administered 2023-01-24: 10 mg via INTRAVENOUS
  Filled 2023-01-24: qty 2

## 2023-01-24 NOTE — Discharge Instructions (Signed)
Today's evaluation has been generally reassuring aside from evidence for dehydration.  Please stay well-hydrated and follow-up with your physician.  Return here for concerning changes in your condition.

## 2023-01-24 NOTE — ED Notes (Signed)
Patient transported to X-ray 

## 2023-01-24 NOTE — ED Triage Notes (Signed)
Patient arrived from home with complaints of NVD over the last two days. Positive orthostatics with EMS. Known Covid-19 exposure.  500 cc fluids given prior to arrival.

## 2023-01-24 NOTE — ED Provider Notes (Signed)
[facility] EMERGENCY DEPARTMENT AT [facility] Provider Note   CSN: [id number] Arrival date & time: 01/24/23  2017     History  Chief Complaint  Patient presents with   Emesis   Diarrhea    Jeff Chandler is a 47 y.o. male.  HPI Patient presents with cough, congestion, weakness, lightheadedness, possible syncope.  Patient states he is generally well with so until the past 2 days.  Patient's history is notable for wife who is COVID-positive.  He has been taking care of his wife at home.    Home Medications Prior to Admission medications   Medication Sig Start Date End Date Taking? Authorizing Provider  ELDERBERRY PO Take 1 tablet by mouth daily.   Yes [provider]  ibuprofen (ADVIL) 200 MG tablet Take 200 mg by mouth every 6 (six) hours as needed for headache or mild pain.   Yes [provider]  acetaminophen (TYLENOL) 325 MG tablet Take 2 tablets (650 mg total) by mouth every 6 (six) hours as needed for moderate pain. Patient not taking: Reported on 01/24/2023 08/12/22   Wallis Bamberg, PA-C  loperamide (IMODIUM) 2 MG capsule Take 1 capsule (2 mg total) by mouth 2 (two) times daily as needed for diarrhea or loose stools. Patient not taking: Reported on 01/24/2023 08/12/22   Wallis Bamberg, PA-C  ondansetron (ZOFRAN-ODT) 8 MG disintegrating tablet Take 1 tablet (8 mg total) by mouth every 8 (eight) hours as needed for nausea or vomiting. Patient not taking: Reported on 01/24/2023 08/12/22   Wallis Bamberg, PA-C      Allergies    Hydrocodone-acetaminophen    Review of Systems   Review of Systems  All other systems reviewed and are negative.   Physical Exam Updated Vital Signs BP 107/65   Pulse 77   Temp (!) 97.5 F (36.4 C) (Oral)   Resp 13   SpO2 100%  Physical Exam Vitals and nursing note reviewed.  Constitutional:      General: He is not in acute distress.    Appearance: He is well-developed.  HENT:     Head: Normocephalic and atraumatic.  Eyes:      Conjunctiva/sclera: Conjunctivae normal.  Cardiovascular:     Rate and Rhythm: Normal rate and regular rhythm.  Pulmonary:     Effort: Pulmonary effort is normal. No respiratory distress.     Breath sounds: No stridor.  Abdominal:     General: There is no distension.  Skin:    General: Skin is warm and dry.  Neurological:     Mental Status: He is alert and oriented to person, place, and time.     ED Results / Procedures / Treatments   Labs (all labs ordered are listed, but only abnormal results are displayed) Labs Reviewed  COMPREHENSIVE METABOLIC PANEL - Abnormal; Notable for the following components:      Result Value   Glucose, Bld 124 (*)    Creatinine, Ser 2.00 (*)    Calcium 8.5 (*)    GFR, Estimated 41 (*)    All other components within normal limits  CBC WITH DIFFERENTIAL/PLATELET - Abnormal; Notable for the following components:   Monocytes Absolute 1.1 (*)    All other components within normal limits  SARS CORONAVIRUS 2 BY RT PCR  ETHANOL    EKG EKG Interpretation Date/Time:  Sunday January 24 2023 21:37:58 EDT Ventricular Rate:  76 PR Interval:  143 QRS Duration:  87 QT Interval:  365 QTC Calculation: 411 R Axis:  69  Text Interpretation: Artifact Otherwise within normal limits Sinus rhythm Confirmed by Gerhard Munch [phone]) on 01/24/2023 9:39:40 PM  Radiology DG Chest 2 View  Result Date: 01/24/2023 CLINICAL DATA:  Nausea, vomiting, and diarrhea. Clinical concern for pneumonia. EXAM: CHEST - 2 VIEW COMPARISON:  None Available. FINDINGS: The heart size and mediastinal contours are within normal limits. Both lungs are clear. No acute osseous abnormality. IMPRESSION: No active cardiopulmonary disease. Electronically Signed   By: Thornell Sartorius M.D.   On: 01/24/2023 22:00    Procedures Procedures    Medications Ordered in ED Medications  sodium chloride 0.9 % bolus 1,000 mL (1,000 mLs Intravenous New Bag/Given 01/24/23 2044)    ED Course/ Medical  Decision Making/ A&P                                 Medical Decision Making Patient with COVID-positive wife presents with nausea fever chills diarrhea.  He is awake, alert, with a nonperitoneal abdomen is hemodynamically unremarkable and her suspicion for symptomatic COVID.  Patient received fluids, analgesics antiemetics, x-ray.  Cardiac 75 sinus normal Pulse ox 100% room air normal   Amount and/or Complexity of Data Reviewed Labs: ordered. Decision-making details documented in ED Course. Radiology: ordered and independent interpretation performed. Decision-making details documented in ED Course.   11:02 PM In no distress, accompanied by his wife, speaking clearly, heart rate 80, blood pressure normal.  We discussed evaluation as far notable for dehydration, otherwise generally reassuring.  COVID test has not yet resulted and the patient is comfortable with discharge with outpatient follow-up with that result, absent evidence for distress, chest pain, hypoxia, little evidence for PE, no evidence for neural compromise.        Final Clinical Impression(s) / ED Diagnoses Final diagnoses:  Close exposure to COVID-19 virus  Dehydration    Rx / DC Orders ED Discharge Orders     None         Gerhard Munch, MD 01/24/23 2303

## 2023-01-24 NOTE — ED Notes (Signed)
Called lab about covid test. Stated that didn't have it. Reswabbed pt a second time and sent to lab

## 2023-05-30 ENCOUNTER — Emergency Department (HOSPITAL_COMMUNITY)
Admission: EM | Admit: 2023-05-30 | Discharge: 2023-05-30 | Disposition: A | Discharge status: Home / Self Care | Payer: Medicaid Other | Attending: Emergency Medicine | Admitting: Emergency Medicine

## 2023-05-30 ENCOUNTER — Emergency Department (HOSPITAL_COMMUNITY): Payer: Medicaid Other

## 2023-05-30 DIAGNOSIS — N132 Hydronephrosis with renal and ureteral calculous obstruction: Secondary | ICD-10-CM | POA: Diagnosis not present

## 2023-05-30 DIAGNOSIS — R1031 Right lower quadrant pain: Secondary | ICD-10-CM | POA: Diagnosis present

## 2023-05-30 DIAGNOSIS — N200 Calculus of kidney: Secondary | ICD-10-CM

## 2023-05-30 LAB — URINALYSIS, ROUTINE W REFLEX MICROSCOPIC
Bilirubin Urine: NEGATIVE
Glucose, UA: NEGATIVE mg/dL
Ketones, ur: NEGATIVE mg/dL
Leukocytes,Ua: NEGATIVE
Nitrite: NEGATIVE
Protein, ur: NEGATIVE mg/dL
Specific Gravity, Urine: 1.026 (ref 1.005–1.030)
pH: 5 (ref 5.0–8.0)

## 2023-05-30 LAB — COMPREHENSIVE METABOLIC PANEL
ALT: 21 U/L (ref 0–44)
AST: 14 U/L — ABNORMAL LOW (ref 15–41)
Albumin: 3.7 g/dL (ref 3.5–5.0)
Alkaline Phosphatase: 112 U/L (ref 38–126)
Anion gap: 11 (ref 5–15)
BUN: 15 mg/dL (ref 6–20)
CO2: 20 mmol/L — ABNORMAL LOW (ref 22–32)
Calcium: 8.8 mg/dL — ABNORMAL LOW (ref 8.9–10.3)
Chloride: 109 mmol/L (ref 98–111)
Creatinine, Ser: 1.48 mg/dL — ABNORMAL HIGH (ref 0.61–1.24)
GFR, Estimated: 58 mL/min — ABNORMAL LOW (ref >60)
Glucose, Bld: 126 mg/dL — ABNORMAL HIGH (ref 70–99)
Potassium: 3.7 mmol/L (ref 3.5–5.1)
Sodium: 140 mmol/L (ref 135–145)
Total Bilirubin: 0.6 mg/dL (ref <1.2)
Total Protein: 6.4 g/dL — ABNORMAL LOW (ref 6.5–8.1)

## 2023-05-30 LAB — I-STAT CHEM 8, ED
BUN: 17 mg/dL (ref 6–20)
Calcium, Ion: 1.19 mmol/L (ref 1.15–1.40)
Chloride: 107 mmol/L (ref 98–111)
Creatinine, Ser: 1.5 mg/dL — ABNORMAL HIGH (ref 0.61–1.24)
Glucose, Bld: 125 mg/dL — ABNORMAL HIGH (ref 70–99)
HCT: 51 % (ref 39.0–52.0)
Hemoglobin: 17.3 g/dL — ABNORMAL HIGH (ref 13.0–17.0)
Potassium: 4.1 mmol/L (ref 3.5–5.1)
Sodium: 142 mmol/L (ref 135–145)
TCO2: 26 mmol/L (ref 22–32)

## 2023-05-30 LAB — CBC WITH DIFFERENTIAL/PLATELET
Abs Immature Granulocytes: 0.08 10*3/uL — ABNORMAL HIGH (ref 0.00–0.07)
Basophils Absolute: 0.1 10*3/uL (ref 0.0–0.1)
Basophils Relative: 1 %
Eosinophils Absolute: 0.1 10*3/uL (ref 0.0–0.5)
Eosinophils Relative: 1 %
HCT: 47.3 % (ref 39.0–52.0)
Hemoglobin: 15.8 g/dL (ref 13.0–17.0)
Immature Granulocytes: 0 %
Lymphocytes Relative: 13 %
Lymphs Abs: 2.3 10*3/uL (ref 0.7–4.0)
MCH: 28.3 pg (ref 26.0–34.0)
MCHC: 33.4 g/dL (ref 30.0–36.0)
MCV: 84.8 fL (ref 80.0–100.0)
Monocytes Absolute: 1 10*3/uL (ref 0.1–1.0)
Monocytes Relative: 6 %
Neutro Abs: 14.4 10*3/uL — ABNORMAL HIGH (ref 1.7–7.7)
Neutrophils Relative %: 79 %
Platelets: 364 10*3/uL (ref 150–400)
RBC: 5.58 MIL/uL (ref 4.22–5.81)
RDW: 13.9 % (ref 11.5–15.5)
WBC: 18 10*3/uL — ABNORMAL HIGH (ref 4.0–10.5)
nRBC: 0 % (ref 0.0–0.2)

## 2023-05-30 LAB — LIPASE, BLOOD: Lipase: 24 U/L (ref 11–51)

## 2023-05-30 LAB — I-STAT CG4 LACTIC ACID, ED: Lactic Acid, Venous: 2.9 mmol/L (ref 0.5–1.9)

## 2023-05-30 MED ORDER — TAMSULOSIN HCL 0.4 MG PO CAPS
0.4000 mg | ORAL_CAPSULE | Freq: Once | ORAL | Status: AC
  Administered 2023-05-30: 0.4 mg via ORAL
  Filled 2023-05-30: qty 1

## 2023-05-30 MED ORDER — LACTATED RINGERS IV BOLUS: 1000.0000 mL | Freq: Once | INTRAVENOUS | Status: DC

## 2023-05-30 MED ORDER — HYDROMORPHONE HCL 1 MG/ML IJ SOLN
0.5000 mg | INTRAMUSCULAR | Status: DC | PRN
  Administered 2023-05-30: 0.5 mg via INTRAVENOUS
  Filled 2023-05-30: qty 1

## 2023-05-30 MED ORDER — SODIUM CHLORIDE 0.9 % IV BOLUS
1000.0000 mL | Freq: Once | INTRAVENOUS | Status: AC
  Administered 2023-05-30: 1000 mL via INTRAVENOUS

## 2023-05-30 MED ORDER — OXYCODONE-ACETAMINOPHEN 5-325 MG PO TABS
1.0000 | ORAL_TABLET | Freq: Three times a day (TID) | ORAL | 0 refills | Status: AC | PRN
Start: 2023-05-30 — End: 2023-06-02

## 2023-05-30 MED ORDER — TAMSULOSIN HCL 0.4 MG PO CAPS: 0.4000 mg | ORAL_CAPSULE | Freq: Every day | ORAL | 0 refills | Status: AC

## 2023-05-30 MED ORDER — OXYCODONE-ACETAMINOPHEN 5-325 MG PO TABS
1.0000 | ORAL_TABLET | Freq: Once | ORAL | Status: AC
  Administered 2023-05-30: 1 via ORAL
  Filled 2023-05-30: qty 1

## 2023-05-30 MED ORDER — ONDANSETRON 4 MG PO TBDP: 4.0000 mg | ORAL_TABLET | Freq: Three times a day (TID) | ORAL | 0 refills | Status: AC | PRN

## 2023-05-30 MED ORDER — IOHEXOL 350 MG/ML SOLN
75.0000 mL | Freq: Once | INTRAVENOUS | Status: AC | PRN
  Administered 2023-05-30: 75 mL via INTRAVENOUS

## 2023-05-30 MED ORDER — HYDROMORPHONE HCL 1 MG/ML IJ SOLN
1.0000 mg | Freq: Once | INTRAMUSCULAR | Status: AC
  Administered 2023-05-30: 1 mg via INTRAVENOUS
  Filled 2023-05-30: qty 1

## 2023-05-30 NOTE — Discharge Instructions (Addendum)
You have a 4 mm stone in your right ureter.  At the time of the CT scan, it was close to passing into the bladder.  You may have already passed it.  Prescriptions were sent to your pharmacy for pain and nausea medication.  Take this only as needed.  Take tamsulosin daily.  Drink plenty of water.  Call the telephone number below to schedule follow-up appointment with urology.  Return to the emergency department for any new or worsening symptoms of concern.

## 2023-05-30 NOTE — ED Provider Triage Note (Signed)
Emergency Medicine Provider Triage Evaluation Note  [facility] , a 47 y.o. male  was evaluated in triage.  Pt complains of abdominal pain.  Review of Systems  Positive: Right lower quadrant abdominal pain Negative: Nausea, vomiting, chest pain, shortness of breath  Physical Exam  There were no vitals taken for this visit. Gen:   Awake, appears uncomfortable, mildly diaphoretic Resp:  Normal effort  MSK:   Moves extremities without difficulty  Other:  RLQ tenderness present  Medical Decision Making  Medically screening exam initiated at 1:02 PM.  Appropriate orders placed.  [facility] was informed that the remainder of the evaluation will be completed by another provider, this initial triage assessment does not replace that evaluation, and the importance of remaining in the ED until their evaluation is complete.  Patient reports for acute right lower quadrant abdominal pain with onset at 11 AM.  It did wake him up from sleep.  He was in his normal state of health yesterday.  Pain has been severe.   Gloris Manchester, MD 05/30/23 1314

## 2023-05-30 NOTE — ED Provider Notes (Signed)
[place] EMERGENCY DEPARTMENT AT [facility] Provider Note   CSN: [id number] Arrival date & time: 05/30/23  1252     History  No chief complaint on file.   Jeff Chandler is a 47 y.o. male.  HPI Patient presents for abdominal pain.  He has no known chronic medical conditions.  Last night, he was in his normal state of health.  He woke up late this morning at 11 AM with severe sharp right lower quadrant abdominal pain.  Symptoms have been persistent and severe since that time.  He denies any associated nausea.  EMS did note he had some diaphoresis.  Patient reports that pain does not radiate.    Home Medications Prior to Admission medications   Medication Sig Start Date End Date Taking? Authorizing Provider  ondansetron (ZOFRAN-ODT) 4 MG disintegrating tablet Take 1 tablet (4 mg total) by mouth every 8 (eight) hours as needed for nausea or vomiting. 05/30/23  Yes Gloris Manchester, MD  oxyCODONE-acetaminophen (PERCOCET/ROXICET) 5-325 MG tablet Take 1 tablet by mouth every 8 (eight) hours as needed for up to 3 days for severe pain (pain score 7-10). 05/30/23 06/02/23 Yes Gloris Manchester, MD  tamsulosin (FLOMAX) 0.4 MG CAPS capsule Take 1 capsule (0.4 mg total) by mouth daily for 3 days. 05/30/23 06/02/23 Yes Gloris Manchester, MD  acetaminophen (TYLENOL) 325 MG tablet Take 2 tablets (650 mg total) by mouth every 6 (six) hours as needed for moderate pain. Patient not taking: Reported on 01/24/2023 08/12/22   Wallis Bamberg, PA-C  ELDERBERRY PO Take 1 tablet by mouth daily.    [provider]  ibuprofen (ADVIL) 200 MG tablet Take 200 mg by mouth every 6 (six) hours as needed for headache or mild pain.    [provider]  loperamide (IMODIUM) 2 MG capsule Take 1 capsule (2 mg total) by mouth 2 (two) times daily as needed for diarrhea or loose stools. Patient not taking: Reported on 01/24/2023 08/12/22   Wallis Bamberg, PA-C  ondansetron (ZOFRAN-ODT) 8 MG disintegrating tablet Take 1 tablet  (8 mg total) by mouth every 8 (eight) hours as needed for nausea or vomiting. Patient not taking: Reported on 01/24/2023 08/12/22   Wallis Bamberg, PA-C      Allergies    Hydrocodone-acetaminophen    Review of Systems   Review of Systems  Constitutional:  Positive for diaphoresis.  Gastrointestinal:  Positive for abdominal pain.  All other systems reviewed and are negative.   Physical Exam Updated Vital Signs BP 119/67   Pulse 91   Temp 97.8 F (36.6 C) (Oral)   Resp 16   SpO2 100%  Physical Exam Vitals and nursing note reviewed.  Constitutional:      General: He is not in acute distress.    Appearance: He is well-developed and normal weight. He is ill-appearing and diaphoretic. He is not toxic-appearing.  HENT:     Head: Normocephalic and atraumatic.     Right Ear: External ear normal.     Left Ear: External ear normal.     Nose: Nose normal.     Mouth/Throat:     Mouth: Mucous membranes are moist.  Eyes:     General: No scleral icterus.    Extraocular Movements: Extraocular movements intact.     Conjunctiva/sclera: Conjunctivae normal.  Cardiovascular:     Rate and Rhythm: Normal rate and regular rhythm.  Pulmonary:     Effort: Pulmonary effort is normal. No respiratory distress.  Abdominal:  Palpations: Abdomen is soft.     Tenderness: There is abdominal tenderness.  Musculoskeletal:        General: No swelling. Normal range of motion.     Cervical back: Normal range of motion and neck supple.  Skin:    General: Skin is warm.     Coloration: Skin is not jaundiced or pale.  Neurological:     General: No focal deficit present.     Mental Status: He is alert and oriented to person, place, and time.  Psychiatric:        Mood and Affect: Mood normal.        Behavior: Behavior normal.     ED Results / Procedures / Treatments   Labs (all labs ordered are listed, but only abnormal results are displayed) Labs Reviewed  COMPREHENSIVE METABOLIC PANEL - Abnormal;  Notable for the following components:      Result Value   CO2 20 (*)    Glucose, Bld 126 (*)    Creatinine, Ser 1.48 (*)    Calcium 8.8 (*)    Total Protein 6.4 (*)    AST 14 (*)    GFR, Estimated 58 (*)    All other components within normal limits  CBC WITH DIFFERENTIAL/PLATELET - Abnormal; Notable for the following components:   WBC 18.0 (*)    Neutro Abs 14.4 (*)    Abs Immature Granulocytes 0.08 (*)    All other components within normal limits  URINALYSIS, ROUTINE W REFLEX MICROSCOPIC - Abnormal; Notable for the following components:   Hgb urine dipstick MODERATE (*)    Bacteria, UA RARE (*)    All other components within normal limits  I-STAT CG4 LACTIC ACID, ED - Abnormal; Notable for the following components:   Lactic Acid, Venous 2.9 (*)    All other components within normal limits  I-STAT CHEM 8, ED - Abnormal; Notable for the following components:   Creatinine, Ser 1.50 (*)    Glucose, Bld 125 (*)    Hemoglobin 17.3 (*)    All other components within normal limits  LIPASE, BLOOD    EKG None  Radiology CT ABDOMEN PELVIS W CONTRAST  Result Date: 05/30/2023 CLINICAL DATA:  Right lower quadrant abdominal pain with nausea, vomiting and diarrhea for 2 days. EXAM: CT ABDOMEN AND PELVIS WITH CONTRAST TECHNIQUE: Multidetector CT imaging of the abdomen and pelvis was performed using the standard protocol following bolus administration of intravenous contrast. RADIATION DOSE REDUCTION: This exam was performed according to the departmental dose-optimization program which includes automated exposure control, adjustment of the mA and/or kV according to patient size and/or use of iterative reconstruction technique. CONTRAST:  75mL OMNIPAQUE IOHEXOL 350 MG/ML SOLN COMPARISON:  Noncontrast abdominal CT 01/15/2004. FINDINGS: Lower chest: Clear lung bases. No significant pleural or pericardial effusion. Hepatobiliary: Mild subjective hepatic steatosis without suspicious focal abnormality  or abnormal enhancement. There is a 1.7 cm gallstone within the gallbladder neck. No gallbladder distension, wall thickening or surrounding inflammation identified. There is no intra or extrahepatic biliary dilatation. Pancreas: Unremarkable. No pancreatic ductal dilatation or surrounding inflammatory changes. Spleen: Normal in size without focal abnormality. Adrenals/Urinary Tract: Both adrenal glands appear normal. There is a 4 mm partially obstructing calculus at the right ureterovesical junction with resulting mild right-sided hydronephrosis and hydroureter. No other urinary tract calculi are demonstrated. There is no significant perinephric soft tissue stranding. In the interpolar region of the left kidney, there is a 1.4 cm simple cyst for which no specific follow-up imaging  is recommended. Both kidneys otherwise appear unremarkable. The bladder appears unremarkable for its degree of distention. Stomach/Bowel: No enteric contrast administered. The stomach appears unremarkable for its degree of distension. No evidence of bowel wall thickening, distention or surrounding inflammatory change. The appendix appears normal. Mild distal colonic diverticulosis. Vascular/Lymphatic: There are no enlarged abdominal or pelvic lymph nodes. Mild aortoiliac atherosclerosis without evidence of aneurysm or large vessel occlusion. Reproductive: The prostate gland and seminal vesicles appear unremarkable. Other: No evidence of abdominal wall mass or hernia. No ascites or pneumoperitoneum. Musculoskeletal: No acute or significant osseous findings. Previous proximal right femoral ORIF. IMPRESSION: 1. Partially obstructing 4 mm calculus at the right ureterovesical junction with resulting mild right-sided hydronephrosis and hydroureter. 2. No other urinary tract calculi. 3. Cholelithiasis without evidence of acute cholecystitis or biliary dilatation. 4. Mild hepatic steatosis. 5.  Aortic Atherosclerosis (ICD10-I70.0). Electronically  Signed   By: Carey Bullocks M.D.   On: 05/30/2023 15:27    Procedures Procedures    Medications Ordered in ED Medications  HYDROmorphone (DILAUDID) injection 0.5 mg (0.5 mg Intravenous Given 05/30/23 1345)  tamsulosin (FLOMAX) capsule 0.4 mg (has no administration in time range)  oxyCODONE-acetaminophen (PERCOCET/ROXICET) 5-325 MG per tablet 1 tablet (1 tablet Oral Given 05/30/23 1346)  sodium chloride 0.9 % bolus 1,000 mL (0 mLs Intravenous Stopped 05/30/23 1647)  HYDROmorphone (DILAUDID) injection 1 mg (1 mg Intravenous Given 05/30/23 1447)  iohexol (OMNIPAQUE) 350 MG/ML injection 75 mL (75 mLs Intravenous Contrast Given 05/30/23 1500)    ED Course/ Medical Decision Making/ A&P                                 Medical Decision Making Amount and/or Complexity of Data Reviewed Labs: ordered. Radiology: ordered.  Risk Prescription drug management.   This patient presents to the ED for concern of right lower quadrant pain, this involves an extensive number of treatment options, and is a complaint that carries with it a high risk of complications and morbidity.  The differential diagnosis includes appendicitis, nephrolithiasis, perforated viscus, pyelonephritis, colitis, constipation   Co morbidities that complicate the patient evaluation  N/A   Additional history obtained:  Additional history obtained from EMS External records from outside source obtained and reviewed including EMR   Lab Tests:  I Ordered, and personally interpreted labs.  The pertinent results include: Leukocytosis is present.  Creatinine at baseline.  Initial lactate is elevated.  Urinalysis does not evidence of infection   Imaging Studies ordered:  I ordered imaging studies including CT of abdomen and pelvis I independently visualized and interpreted imaging which showed 4 mm ureteral calculus at right UVJ, mild upstream hydronephrosis.  No other acute findings I agree with the radiologist  interpretation   Cardiac Monitoring: / EKG:  The patient was maintained on a cardiac monitor.  I personally viewed and interpreted the cardiac monitored which showed an underlying rhythm of: Sinus rhythm  Problem List / ED Course / Critical interventions / Medication management  Patient presenting for acute onset of right lower quadrant abdominal pain, starting this morning at 11 AM.  On arrival, he appears uncomfortable.  He is mildly diaphoretic.  He is holding his right lower quadrant.  Area is tender.  Percocet, Dilaudid were ordered for analgesia.  Workup was initiated.  He will require CT imaging.  After additional Dilaudid, patient did have controlled pain.  Lab work is notable for leukocytosis.  His creatinine does  appear to be baseline.  He underwent CT imaging which does show right sided ureteral stone.  On reassessment, patient's pain now much improved.  He is now well-appearing.  Urinalysis is without evidence of infection.  Patient's pain remained resolved other than some mild residual soreness.  He may have passed a stone while in the ED.  He was advised to follow-up with urology and to return for any worsening of symptoms.  He was discharged in good condition. I ordered medication including Dilaudid and Percocet for analgesia; IV fluids for hydration Reevaluation of the patient after these medicines showed that the patient improved I have reviewed the patients home medicines and have made adjustments as needed   Social Determinants of Health:  Lives independently         Final Clinical Impression(s) / ED Diagnoses Final diagnoses:  Kidney stone    Rx / DC Orders ED Discharge Orders          Ordered    ondansetron (ZOFRAN-ODT) 4 MG disintegrating tablet  Every 8 hours PRN        05/30/23 1719    oxyCODONE-acetaminophen (PERCOCET/ROXICET) 5-325 MG tablet  Every 8 hours PRN        05/30/23 1719    tamsulosin (FLOMAX) 0.4 MG CAPS capsule  Daily        05/30/23 1720               Gloris Manchester, MD 05/30/23 1720

## 2023-05-30 NOTE — ED Triage Notes (Signed)
Pt BIB [facility] from home for sudden onset of sharp stabbing RLQ abd pain.  Woke up from sleep around 11am  Pain is worse en route.  Pt is cool and clammy.  No rebound tenderness, pain on palpation.  Pt noted swelling to RLQ. No NV  106/70 HR80 RR 18 99%
